# Patient Record
Sex: Male | Born: 1985 | Race: White | Hispanic: No | Marital: Single | Smoking: Former smoker
Health system: Southern US, Community
[De-identification: ages and names within clinical notes are randomized; demographics above are authoritative.]

## PROBLEM LIST (undated history)

## (undated) DIAGNOSIS — E7800 Pure hypercholesterolemia, unspecified: Secondary | ICD-10-CM

## (undated) DIAGNOSIS — I10 Essential (primary) hypertension: Secondary | ICD-10-CM

## (undated) HISTORY — DX: Essential (primary) hypertension: I10

## (undated) HISTORY — DX: Pure hypercholesterolemia, unspecified: E78.00

---

## 2005-04-22 ENCOUNTER — Ambulatory Visit: Payer: Self-pay | Admitting: Pediatrics

## 2005-10-31 ENCOUNTER — Ambulatory Visit: Payer: Self-pay | Admitting: Pediatrics

## 2006-02-20 ENCOUNTER — Ambulatory Visit: Payer: Self-pay | Admitting: Pediatrics

## 2006-08-08 ENCOUNTER — Ambulatory Visit: Payer: Self-pay | Admitting: Pediatrics

## 2006-11-24 ENCOUNTER — Ambulatory Visit: Payer: Self-pay | Admitting: Pediatrics

## 2007-06-03 ENCOUNTER — Ambulatory Visit: Payer: Self-pay | Admitting: Pediatrics

## 2007-09-15 ENCOUNTER — Ambulatory Visit: Payer: Self-pay | Admitting: Pediatrics

## 2008-03-30 ENCOUNTER — Ambulatory Visit: Payer: Self-pay | Admitting: Pediatrics

## 2008-08-03 ENCOUNTER — Ambulatory Visit: Payer: Self-pay | Admitting: Pediatrics

## 2008-11-01 ENCOUNTER — Ambulatory Visit: Payer: Self-pay | Admitting: Pediatrics

## 2009-02-15 ENCOUNTER — Ambulatory Visit: Payer: Self-pay | Admitting: Pediatrics

## 2009-06-21 ENCOUNTER — Ambulatory Visit: Payer: Self-pay | Admitting: Pediatrics

## 2010-04-25 ENCOUNTER — Emergency Department (HOSPITAL_COMMUNITY): Admission: EM | Admit: 2010-04-25 | Discharge: 2010-04-25 | Payer: Self-pay | Admitting: Emergency Medicine
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
## 2018-04-21 ENCOUNTER — Ambulatory Visit
Admission: RE | Admit: 2018-04-21 | Discharge: 2018-04-21 | Disposition: A | Discharge status: Home / Self Care | Payer: Managed Care, Other (non HMO) | Source: Ambulatory Visit | Attending: Nurse Practitioner | Admitting: Nurse Practitioner

## 2018-04-21 ENCOUNTER — Other Ambulatory Visit: Payer: Self-pay | Admitting: Nurse Practitioner

## 2018-04-21 DIAGNOSIS — M545 Low back pain: Secondary | ICD-10-CM

## 2018-05-04 ENCOUNTER — Ambulatory Visit: Payer: Managed Care, Other (non HMO) | Admitting: Physical Therapy

## 2018-05-04 ENCOUNTER — Encounter: Payer: Self-pay | Admitting: Physical Therapy

## 2018-05-04 NOTE — Therapy (Signed)
[facility] [address], [address] [place], [place], [address] Phone: [phone]   Fax:  [phone]  Physical Therapy Evaluation  Patient Details  Name: Jonathon LeechDonald Mcmenamin MRN: [id number] Date of Birth: 1986-06-27 No data recorded  Encounter Date: 05/04/2018    History reviewed. No pertinent past medical history.  History reviewed. No pertinent surgical history.  There were no vitals filed for this visit.    Patient left before the therapist was able to call his name for the initial evaluation.  Patient left due to insurance coverage.  Eulis FosterCheryl Clayton Bosserman, PT 05/04/18 12:36 PM                Objective measurements completed on examination: See above findings.                             Patient will benefit from skilled therapeutic intervention in order to improve the following deficits and impairments:     Visit Diagnosis: Chronic midline low back pain without sciatica     Problem List There are no active problems to display for this patient.   Brayen Bunn 05/04/2018, 12:36 PM  [facility] [address], [address] [place], [place], [address] Phone: [phone]   Fax:  [phone]  Name: Jonathon LeechDonald Lowman MRN: [id number] Date of Birth: 1986-06-27

## 2019-10-28 DIAGNOSIS — I10 Essential (primary) hypertension: Secondary | ICD-10-CM | POA: Diagnosis not present

## 2019-10-28 DIAGNOSIS — F900 Attention-deficit hyperactivity disorder, predominantly inattentive type: Secondary | ICD-10-CM | POA: Diagnosis not present

## 2020-04-24 DIAGNOSIS — I10 Essential (primary) hypertension: Secondary | ICD-10-CM | POA: Diagnosis not present

## 2020-04-24 DIAGNOSIS — F900 Attention-deficit hyperactivity disorder, predominantly inattentive type: Secondary | ICD-10-CM | POA: Diagnosis not present

## 2022-09-16 ENCOUNTER — Encounter: Payer: Self-pay | Admitting: Neurology

## 2022-10-10 ENCOUNTER — Encounter: Payer: Self-pay | Admitting: Internal Medicine

## 2022-10-10 ENCOUNTER — Ambulatory Visit: Payer: Managed Care, Other (non HMO) | Attending: Internal Medicine | Admitting: Internal Medicine

## 2022-10-10 VITALS — BP 104/74 | HR 78 | Ht 71.5 in | Wt 177.8 lb

## 2022-10-10 DIAGNOSIS — E785 Hyperlipidemia, unspecified: Secondary | ICD-10-CM | POA: Diagnosis not present

## 2022-10-10 DIAGNOSIS — I499 Cardiac arrhythmia, unspecified: Secondary | ICD-10-CM

## 2022-10-10 NOTE — Patient Instructions (Signed)
Medication Instructions:  No changes *If you need a refill on your cardiac medications before your next appointment, please call your pharmacy*  Follow-Up: At [facility], you and your health needs are our priority.  As part of our continuing mission to provide you with exceptional heart care, we have created designated Provider Care Teams.  These Care Teams include your primary Cardiologist (physician) and Advanced Practice Providers (APPs -  Physician Assistants and Nurse Practitioners) who all work together to provide you with the care you need, when you need it.  We recommend signing up for the patient portal called "MyChart".  Sign up information is provided on this After Visit Summary.  MyChart is used to connect with patients for Virtual Visits (Telemedicine).  Patients are able to view lab/test results, encounter notes, upcoming appointments, etc.  Non-urgent messages can be sent to your provider as well.   To learn more about what you can do with MyChart, go to [email or web address].    Your next appointment:    Follow up as needed  Provider:   Dr Branch   

## 2022-10-10 NOTE — Progress Notes (Signed)
Cardiology Office Note:    Date:  10/10/2022   ID:  Jonathon Holmes, DOB 07/07/1986, MRN [id number]  PCP:  Lavone Orn, [place] Providers Cardiologist:  None     Referring MD: Johna Roles, PA   No chief complaint on file. Sinus pause/ junctional rhythm  History of Present Illness:    Jonathon Holmes is a 37 y.o. male with no significant hx. ADD. He was seen by [facility]. He reported syncopal/seizure episode on 12/31. He was standing in the kitchen and ws confused and fell to the ground. His body was stiff and he lost consciousness for ~ 30 seconds to 1 minute. He was tired afterwards and slept for 12-16 hours. Was using CBD.  He had a ziopatch ordered which showed a 5 second sinus pause [ no time noted] , junctional rhythm in the 30s in the early AM that were triggered events  No other prior syncope  Current Medications: Current Outpatient Medications on File Prior to Visit  Medication Sig Dispense Refill   amphetamine-dextroamphetamine (ADDERALL) 10 MG tablet Take 10 mg by mouth daily.     lisinopril (ZESTRIL) 10 MG tablet Take 10 mg by mouth daily.     sertraline (ZOLOFT) 25 MG tablet Take 25 mg by mouth daily.     No current facility-administered medications on file prior to visit.    Allergies:   Patient has no known allergies.   Social History   Socioeconomic History   Marital status: Single    Spouse name: Not on file   Number of children: Not on file   Years of education: Not on file   Highest education level: Not on file  Occupational History   Not on file  Tobacco Use   Smoking status: Former    Types: Cigarettes   Smokeless tobacco: Never   Tobacco comments:    CBD GUMMIES  Substance and Sexual Activity   Alcohol use: Not on file   Drug use: Not on file   Sexual activity: Not on file  Other Topics Concern   Not on file  Social History Narrative   Not on file   Social Determinants of Health   Financial Resource Strain:  Not on file  Food Insecurity: Not on file  Transportation Needs: Not on file  Physical Activity: Not on file  Stress: Not on file  Social Connections: Not on file     Family History: Grandfather had MI at 31   ROS:   Please see the history of present illness.     All other systems reviewed and are negative.  EKGs/Labs/Other Studies Reviewed:    The following studies were reviewed today:   EKG:  EKG is  ordered today.  The ekg ordered today demonstrates   10/10/2022- NSR  Recent Labs: No results found for requested labs within last 365 days.  Recent Lipid Panel No results found for: "CHOL", "TRIG", "HDL", "CHOLHDL", "VLDL", "LDLCALC", "LDLDIRECT"   Risk Assessment/Calculations:     Physical Exam:    VS:   Vitals:   10/10/22 1612  BP: 104/74  Pulse: 78  SpO2: 95%     Wt Readings from Last 3 Encounters:  10/10/22 177 lb 12.8 oz (80.6 kg)     GEN:  Well nourished, well developed in no acute distress HEENT: Normal NECK: No JVD; No carotid bruits LYMPHATICS: No lymphadenopathy CARDIAC: RRR, no murmurs, rubs, gallops RESPIRATORY:  Clear to auscultation without rales, wheezing or rhonchi  ABDOMEN:  Soft, non-tender, non-distended MUSCULOSKELETAL:  No edema; No deformity  SKIN: Warm and dry NEUROLOGIC:  Alert and oriented x 3 PSYCHIATRIC:  Normal affect   ASSESSMENT:    Sinus pause; junctional rhythm: suspect in the setting of substance use. Recommend refraining from any substance use, it can be a trigger. PLAN:    In order of problems listed above:   No further cardiac follow up at this time      Medication Adjustments/Labs and Tests Ordered: Current medicines are reviewed at length with the patient today.  Concerns regarding medicines are outlined above.  Orders Placed This Encounter  Procedures   EKG 12-Lead   No orders of the defined types were placed in this encounter.   Patient Instructions  Medication Instructions:  No changes *If you  need a refill on your cardiac medications before your next appointment, please call your pharmacy*   Follow-Up: At [facility], you and your health needs are our priority.  As part of our continuing mission to provide you with exceptional heart care, we have created designated Provider Care Teams.  These Care Teams include your primary Cardiologist (physician) and Advanced Practice Providers (APPs -  Physician Assistants and Nurse Practitioners) who all work together to provide you with the care you need, when you need it.  We recommend signing up for the patient portal called "MyChart".  Sign up information is provided on this After Visit Summary.  MyChart is used to connect with patients for Virtual Visits (Telemedicine).  Patients are able to view lab/test results, encounter notes, upcoming appointments, etc.  Non-urgent messages can be sent to your provider as well.   To learn more about what you can do with MyChart, go to [email or web address].    Your next appointment:    Follow up as needed  Provider:    Dr Harl Bowie     Signed, Janina Mayo, MD  10/10/2022 4:25 PM    [place]

## 2022-10-16 ENCOUNTER — Encounter: Payer: Self-pay | Admitting: Neurology

## 2022-10-16 ENCOUNTER — Ambulatory Visit (INDEPENDENT_AMBULATORY_CARE_PROVIDER_SITE_OTHER): Payer: 59 | Admitting: Neurology

## 2022-10-16 VITALS — BP 118/81 | HR 73 | Ht 71.0 in | Wt 178.4 lb

## 2022-10-16 DIAGNOSIS — R4020 Unspecified coma: Secondary | ICD-10-CM

## 2022-10-16 NOTE — Progress Notes (Signed)
NEUROLOGY CONSULTATION NOTE  N[place] Cradle MRN: [id number] DOB: 10-09-1985  Referring provider: Thayer Ohm, PA Primary care provider: Thayer Ohm, PA  Reason for consult:  seizure-like activity   Thank you for your kind referral of Zephaniah Lubrano for consultation of the above symptoms. Although his history is well known to you, please allow me to reiterate it for the purpose of our medical record. The patient was accompanied to the clinic by his wife Karsten Fells who also provides collateral information. Records and images were personally reviewed where available.   HISTORY OF PRESENT ILLNESS: This is a 37 year old right-handed man with a history of hypertension, hyperlipidemia, ADD, presenting for evaluation of a seizure-like episode that occurred on 09/08/2022. He reports that he had been under a lot of stress from his work the week prior. On 09/08/22, Britney recalls that he seemed to have more energy/more excitable. He recalls talking to his sister in the kitchen then all of a sudden there was something in his head that felt really weird. He tried to reorient himself and recalls holding on to the counter. His next recollection was waking up on the floor. Britney reports he sat himself on the floor and did a controlled fall on his side then rolled on his back and went stiff. Eyes were open, he made a groan from the back of his throat. This lasted 30 seconds then he came out of it really scared and stood up, shot down the hallway then collapsed. He remembers waking up and freaking out, standing up fast and then falling. They both recall he was diaphoretic, sweating through his clothes, pale. He felt very exhausted, wanting to go to sleep. No tongue bite, incontinence, focal weakness. EMS arrived within 5-10 minutes with note of normal BP, glucose, EKG. No further episodes of loss of consciousness, however since then, he has had significant panic attacks, which are new symptoms for him. They deny  any prior history of anxiety. Since 12/31, he has had 3 panic attacks. The first episode occurred a week after, he had been thrown off his routine and it was the first day he was supposed to go back to work after the event. He then started feeling impending doom while he was in the bathroom. His legs started going, he was able to get back to bed and felt lightheaded, plopping down on the bed and asking for help. Britney woke up and he fell to the floor, "sweating puddles of sweat on the floor." He felt an intense cold with shivering, and he could not straighten out his left leg (flexed at the knee). He did not think he could move his hands. Legs were painful because of the cramping. He felt numbness and tingling down his spine. The next episode occurred at work, he again felt the sensation of impending doom and started doing deep breathing but felt he could not complete his breaths. He managed to get to a coworker, then he saw his other coworker who had previously written him up and symptoms got exceptionally worse. He almost fell to the floor, his hands were starting to curl in and legs felt weak. He called his wife and she had to half carry/half pull him into the house. The episode lasted 1.5 hours. The last episode was a week ago. He got a call from his insurance company and was fixated on his head about going back to work and dealing with his coworker, he started getting more agitated with uncontrollable crying. He  ended up crawling to get his phone and call Britney, who was able to talk him down. Episode lasted 25-30 minutes. There is no staring/unresponsiveness or confusion/speech difficulties with all these episodes.   He has noticed that since the event in December, he would smell or taste random things or have random cravings. He has been having more headaches with tingling in the back of his head. Sometimes he has a pressure in the midline which scares him. This is associated with some nausea. He gets  nauseated with quick turns. He has been having pain in his back radiating down the left leg. No bowel/bladder dysfunction. No myoclonic jerks. He has had personality changes, now emotionally extremely irritable/volatile, crying at the drop of a hat, which is very atypical for him. He has noticed he is not able to articulate like before. He was started on Sertraline after the last panic attack last week. He states that even now in the office he is feeling weird, looking up does not feel good. He lives with [place]. He works for [employer] and does double shifts. Standard is "too little sleep." No change in sleep patterns prior to the event in December. His maternal grandmother had panic attacks. He had a normal birth and early development.  There is no history of febrile convulsions, CNS infections such as meningitis/encephalitis, significant traumatic brain injury, neurosurgical procedures, or family history of seizures.  He was evaluated by Cardiology earlier this month, he had a Zio patch which showed a 5-second sinus pause, junctional rhythm in the 30s in the early AM that were triggered events. Notes indicate these are suspected triggered by substance use, no further follow-up needed.    PAST MEDICAL HISTORY: Past Medical History:  Diagnosis Date   High cholesterol    Hypertension     PAST SURGICAL HISTORY: History reviewed. No pertinent surgical history.  MEDICATIONS: Current Outpatient Medications on File Prior to Visit  Medication Sig Dispense Refill   amphetamine-dextroamphetamine (ADDERALL) 10 MG tablet Take 10 mg by mouth daily.     lisinopril (ZESTRIL) 10 MG tablet Take 10 mg by mouth daily.     Multiple Vitamin (MULTIVITAMIN) tablet Take 1 tablet by mouth daily.     Omega-3 Fatty Acids (FISH OIL OMEGA-3 PO) Take by mouth.     sertraline (ZOLOFT) 25 MG tablet Take 25 mg by mouth daily.     No current facility-administered medications on file prior to visit.    ALLERGIES: Allergies   Allergen Reactions   Statins Other (See Comments)    Pain and stiffness     FAMILY HISTORY: Family History  Problem Relation Age of Onset   Diabetes Mother    Hypertension Mother    Diabetes Father    Hypertension Father    High Cholesterol Maternal Grandmother    Hypertension Maternal Grandmother    High Cholesterol Maternal Grandfather    Hypertension Maternal Grandfather    High Cholesterol Paternal Grandmother    Hypertension Paternal Grandmother    Hypertension Paternal Grandfather    High Cholesterol Paternal Grandfather     SOCIAL HISTORY: Social History   Socioeconomic History   Marital status: Single    Spouse name: Not on file   Number of children: Not on file   Years of education: Not on file   Highest education level: Not on file  Occupational History   Not on file  Tobacco Use   Smoking status: Former    Types: Cigarettes   Smokeless tobacco: Never  Tobacco comments:    CBD GUMMIES  Vaping Use   Vaping Use: Former  Substance and Sexual Activity   Alcohol use: Yes    Comment: occ   Drug use: Not Currently    Comment: CBD   Sexual activity: Not on file  Other Topics Concern   Not on file  Social History Narrative   Are you right handed or left handed? Right handed    Are you currently employed ?  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 No    Who lives with you? wife   What type of home do you live in: 1 story or 2 story?  1 story        Social Determinants of Radio broadcast assistant Strain: Not on file  Food Insecurity: Not on file  Transportation Needs: Not on file  Physical Activity: Not on file  Stress: Not on file  Social Connections: Not on file  Intimate Partner Violence: Not on file     PHYSICAL EXAM: Vitals:   10/16/22 1026  BP: 118/81  Pulse: 73  SpO2: 97%   General: No acute distress Head:  Normocephalic/atraumatic Skin/Extremities: No rash, no  edema Neurological Exam: Mental status: alert and oriented to person, place, and time, no dysarthria or aphasia, Fund of knowledge is appropriate.  Recent and remote memory are intact, 3/3 delayed recall.  Attention and concentration are normal, 5/5 WORLD backwards.  Cranial nerves: CN I: not tested CN II: pupils equal, round, visual fields intact CN III, IV, VI:  full range of motion, no nystagmus, no ptosis CN V: facial sensation intact but reports delay with feeling cold sensation on left V1-2 CN VII: upper and lower face symmetric CN VIII: hearing intact to conversation Bulk & Tone: normal, no fasciculations. Motor: 5/5 throughout with no pronator drift. Sensation: intact to light touch, cold on both UE, intact cold on both LE, decreased pin on left LE, intact vibration sense.  Romberg test negative Deep Tendon Reflexes: +1 throughout Cerebellar: no incoordination on finger to nose testing Gait: narrow-based and steady, able to tandem walk adequately. Tremor: none   IMPRESSION: This is a 37 year old right-handed man with a history of hypertension, hyperlipidemia, ADD, presenting for evaluation of a seizure-like episode that occurred on 09/08/2022. He felt a sensation in his head then had a witnessed episode of loss of consciousness/stiffening for 30 seconds. He was pale and diaphoretic afterwards, ?convulsive syncope versus seizure. No further similar episodes since 12/31, however he has new changes in personality as well as panic attacks which he did not have previously. Neurological exam overall normal. Etiology symptoms unclear, he had a heart monitor and has seen Cardiology. From a neurological standpoint, we will do a brain MRI with and without contrast and 1-hour EEG. If normal, a 48-hour EEG will be ordered to further classify symptoms. With possible seizure and change in personality, bloodwork for autoimmune panel will be ordered. [place] driving laws were discussed with the patient, and  he knows to stop driving after an episode of loss of consciousness until 6 months event-free. Follow-up after tests, call for any changes.    Thank you for allowing me to participate in the care of this patient. Please do not hesitate to call for any questions or concerns.   Ellouise Newer, M.D.  CC: Thayer Ohm, [place]

## 2022-10-16 NOTE — Patient Instructions (Signed)
Good to meet you.  Schedule MRI brain with and without contrast  2. Schedule 1-hour EEG. If normal, we will do a 2-day home EEG  3. It is prudent to recommend that all persons should be free of syncopal (passing out) episodes for at least six months to be granted the driving privilege." ([place], Second Edition, Medical Review Branch, Engineer, site, Division of [employer] of Transportation, July 2004)  4. Follow-up after tests, call for any changes

## 2022-10-23 ENCOUNTER — Other Ambulatory Visit: Payer: 59

## 2022-10-24 ENCOUNTER — Ambulatory Visit (INDEPENDENT_AMBULATORY_CARE_PROVIDER_SITE_OTHER): Payer: 59 | Admitting: Neurology

## 2022-10-24 DIAGNOSIS — R4020 Unspecified coma: Secondary | ICD-10-CM | POA: Diagnosis not present

## 2022-10-30 NOTE — Procedures (Signed)
ELECTROENCEPHALOGRAM REPORT  Date of Study: 10/24/2022  Patient's Name: Jonathon Holmes MRN: [id number] Date of Birth: Nov 22, 1985  Referring Provider: Dr. Ellouise Newer  Clinical History: This is a 37 year old man with episode of loss of consciousness/stiffening. EEG for classification.  Medications: Adderall, Lisinopril, Sertraline  Technical Summary: A multichannel digital 1-hour EEG recording measured by the international 10-20 system with electrodes applied with paste and impedances below 5000 ohms performed in our laboratory with EKG monitoring in an awake and asleep patient.  Hyperventilation and photic stimulation were performed.  The digital EEG was referentially recorded, reformatted, and digitally filtered in a variety of bipolar and referential montages for optimal display.    Description: The patient is awake and asleep during the recording.  During maximal wakefulness, there is a symmetric, medium voltage 10.5-11 Hz posterior dominant rhythm that attenuates with eye opening.  The record is symmetric.  During drowsiness and sleep, there is an increase in theta slowing of the background.  Vertex waves and symmetric sleep spindles were seen. Hyperventilation and photic stimulation did not elicit any abnormalities.  There were no epileptiform discharges or electrographic seizures seen.    EKG lead was unremarkable.  Impression: This 1-hour awake and asleep EEG is normal.    Clinical Correlation: A normal EEG does not exclude a clinical diagnosis of epilepsy.  If further clinical questions remain, prolonged EEG may be helpful.  Clinical correlation is advised.   Ellouise Newer, M.D.

## 2022-10-31 ENCOUNTER — Telehealth: Payer: Self-pay

## 2022-10-31 DIAGNOSIS — R4020 Unspecified coma: Secondary | ICD-10-CM

## 2022-10-31 NOTE — Telephone Encounter (Signed)
Pt called an informed that the EEG is normal. Proceed with brain MRI as scheduled. We discussed doing a 48-hour EEG if the routine EEG is normal, if he would like to proceed. Pt would like to proceed with 48 hour eeg order placed

## 2022-10-31 NOTE — Telephone Encounter (Signed)
-----   Message from Cameron Sprang, MD sent at 10/31/2022  8:56 AM EST ----- Pls let him know the EEG is normal. Proceed with brain MRI as scheduled. We discussed doing a 48-hour EEG if the routine EEG is normal, if he would like to proceed, pls order, thanks

## 2022-11-06 ENCOUNTER — Other Ambulatory Visit: Payer: 59

## 2022-11-08 ENCOUNTER — Telehealth: Payer: Self-pay | Admitting: *Deleted

## 2022-12-03 ENCOUNTER — Telehealth: Payer: Self-pay | Admitting: Anesthesiology

## 2022-12-03 ENCOUNTER — Encounter: Payer: Self-pay | Admitting: Neurology

## 2022-12-03 NOTE — Telephone Encounter (Signed)
Pt called he has not had any more seizures. He stated that he is ok if you write that due to his medical condition, he cannot drive until 6 months from last episode

## 2022-12-03 NOTE — Telephone Encounter (Signed)
Has he had any more since 12/31? I can write that due to his medical condition, he cannot drive until 6 months from last episode. Is he okay with this?

## 2022-12-03 NOTE — Telephone Encounter (Signed)
Done, thanks

## 2022-12-03 NOTE — Telephone Encounter (Signed)
Pt called informed letter is ready

## 2022-12-03 NOTE — Telephone Encounter (Signed)
Pt called stating he would like for Dr Delice Lesch to write a note for his job stating that since he is not able to drive right now due his seizures it is a medical accomodation to have his family drive him to work and pick him up as well. States his manager is not being accommodating and its keeping him longer at work. Requests call back.

## 2023-02-10 ENCOUNTER — Encounter: Payer: Self-pay | Admitting: Neurology

## 2023-08-04 ENCOUNTER — Other Ambulatory Visit (HOSPITAL_COMMUNITY): Payer: Self-pay | Admitting: Physician Assistant

## 2023-08-04 DIAGNOSIS — E7800 Pure hypercholesterolemia, unspecified: Secondary | ICD-10-CM

## 2023-09-15 ENCOUNTER — Ambulatory Visit (HOSPITAL_COMMUNITY)
Admission: RE | Admit: 2023-09-15 | Discharge: 2023-09-15 | Disposition: A | Discharge status: Home / Self Care | Payer: Self-pay | Source: Ambulatory Visit | Attending: Physician Assistant | Admitting: Physician Assistant

## 2023-09-15 DIAGNOSIS — E7800 Pure hypercholesterolemia, unspecified: Secondary | ICD-10-CM | POA: Insufficient documentation

## 2024-03-17 ENCOUNTER — Encounter: Payer: Self-pay | Admitting: Physician Assistant

## 2024-03-17 ENCOUNTER — Ambulatory Visit (INDEPENDENT_AMBULATORY_CARE_PROVIDER_SITE_OTHER): Admitting: Physician Assistant

## 2024-03-17 ENCOUNTER — Other Ambulatory Visit (INDEPENDENT_AMBULATORY_CARE_PROVIDER_SITE_OTHER)

## 2024-03-17 DIAGNOSIS — G8929 Other chronic pain: Secondary | ICD-10-CM

## 2024-03-17 DIAGNOSIS — M545 Low back pain, unspecified: Secondary | ICD-10-CM

## 2024-03-17 NOTE — Progress Notes (Signed)
 Office Visit Note   Patient: Jonathon Holmes           Date of Birth: 10/23/1985           MRN: [id number] Visit Date: 03/17/2024              Requested by: Alys Schuyler HERO, PA [address] [place],  [place] [address] PCP: Alys Schuyler HERO, [place]   Assessment & Plan: Visit Diagnoses:  1. Chronic bilateral low back pain without sciatica     Plan: Patient is a very pleasant 38 year old gentleman who works for [employer] and is very active.  He is the father of his 2-month-old child and spends a lot of time with her and his wife.  He comes in today with a long history of low back pain dating back to when he was 38 years old.  He says he gets about an issue about once a year where he is completely almost bedridden with pain.  He is concerned because this is getting more frequent.  He has tried alternative trigger treatments including chiropractic care, massage therapy and back strengthening exercises.  It every year this seems to be worse and more prolonged.  His most recent episode started 6 weeks ago when he was in bed for a week he is just recovering from this now.  He has made some modifications to his job.  I am concerned as he said now he is starting to get significant weakness in his left leg and it gives out on him with associated paresthesias.  He does have a positive straight leg raise.  He states that recently when he was taking care of his daughter he had to crawl on the floor because he could not walk.  No loss of bowel or bladder control per se because of this and the length of time and his weakness and progressive pattern I have recommended an MRI and then follow-up with Megan.  Has also failed treatment with anti-inflammatories and steroids  Follow-Up Instructions: With Megan after MRI  Orders:  Orders Placed This Encounter  Procedures   XR Lumbar Spine 2-3 Views   No orders of the defined types were placed in this encounter.     Procedures: No procedures  performed   Clinical Data: No additional findings.   Subjective: No chief complaint on file.   HPI patient is a 38 year old gentleman who comes in today with a history of being bedridden with back pain for 7 days he was told he had a pinched nerve he has been taking Aleve and ibuprofen without much relief   Review of Systems  All other systems reviewed and are negative.    Objective: Vital Signs: There were no vitals taken for this visit.  Physical Exam Constitutional:      Appearance: Normal appearance.  Pulmonary:     Effort: Pulmonary effort is normal.  Skin:    General: Skin is warm and dry.  Neurological:     General: No focal deficit present.     Mental Status: He is alert and oriented to person, place, and time.  Psychiatric:        Mood and Affect: Mood normal.        Behavior: Behavior normal.     Ortho Exam Patient of his lower back he has pain with forward flexion and can only slowly extend to his standing after bending forward.  Pain over the lower back into the left buttock with extension.  He has a positive straight leg raise on the left none on the right.  He has altered sensation that runs from his left lower buttock down his leg to his toes.  Strength is intact with good dorsiflexion plantarflexion extension and flexion of his legs.  No step-offs noted on exam Specialty Comments:  No specialty comments available.  Imaging: No results found.   PMFS History: There are no active problems to display for this patient.  Past Medical History:  Diagnosis Date   High cholesterol    Hypertension     Family History  Problem Relation Age of Onset   Diabetes Mother    Hypertension Mother    Diabetes Father    Hypertension Father    High Cholesterol Maternal Grandmother    Hypertension Maternal Grandmother    High Cholesterol Maternal Grandfather    Hypertension Maternal Grandfather    High Cholesterol Paternal Grandmother    Hypertension Paternal  Grandmother    Hypertension Paternal Grandfather    High Cholesterol Paternal Grandfather     History reviewed. No pertinent surgical history. Social History   Occupational History   Not on file  Tobacco Use   Smoking status: Former    Types: Cigarettes   Smokeless tobacco: Never   Tobacco comments:    CBD GUMMIES  Vaping Use   Vaping status: Former  Substance and Sexual Activity   Alcohol use: Yes    Comment: occ   Drug use: Not Currently    Comment: CBD   Sexual activity: Not on file

## 2024-03-22 ENCOUNTER — Encounter (HOSPITAL_BASED_OUTPATIENT_CLINIC_OR_DEPARTMENT_OTHER): Payer: Self-pay | Admitting: Internal Medicine

## 2024-03-22 ENCOUNTER — Other Ambulatory Visit (HOSPITAL_COMMUNITY): Payer: Self-pay

## 2024-03-22 ENCOUNTER — Ambulatory Visit (INDEPENDENT_AMBULATORY_CARE_PROVIDER_SITE_OTHER): Admitting: Internal Medicine

## 2024-03-22 ENCOUNTER — Telehealth: Payer: Self-pay | Admitting: Pharmacy Technician

## 2024-03-22 ENCOUNTER — Telehealth (HOSPITAL_BASED_OUTPATIENT_CLINIC_OR_DEPARTMENT_OTHER): Payer: Self-pay

## 2024-03-22 VITALS — BP 130/79 | HR 66 | Ht 71.0 in | Wt 191.0 lb

## 2024-03-22 DIAGNOSIS — Z8249 Family history of ischemic heart disease and other diseases of the circulatory system: Secondary | ICD-10-CM | POA: Diagnosis not present

## 2024-03-22 DIAGNOSIS — R931 Abnormal findings on diagnostic imaging of heart and coronary circulation: Secondary | ICD-10-CM | POA: Diagnosis not present

## 2024-03-22 DIAGNOSIS — G720 Drug-induced myopathy: Secondary | ICD-10-CM | POA: Diagnosis not present

## 2024-03-22 DIAGNOSIS — E785 Hyperlipidemia, unspecified: Secondary | ICD-10-CM

## 2024-03-22 DIAGNOSIS — E7800 Pure hypercholesterolemia, unspecified: Secondary | ICD-10-CM

## 2024-03-22 DIAGNOSIS — T466X5D Adverse effect of antihyperlipidemic and antiarteriosclerotic drugs, subsequent encounter: Secondary | ICD-10-CM

## 2024-03-22 NOTE — Telephone Encounter (Signed)
 Genetic test for Hypercholesteremia ordered ([facility]) Cheek swab completed in office Specimen and necessary paperwork mailed. ID: [id number]

## 2024-03-22 NOTE — Telephone Encounter (Signed)
 Pharmacy Patient Advocate Encounter   Received notification from Physician's Office that prior authorization for Repatha is required/requested.   Insurance verification completed.   The patient is insured through [place] .   Per test claim: The current 03/22/24 day co-pay is, $24.99- one month.  No PA needed at this time. This test claim was processed through [facility]- copay amounts may vary at other pharmacies due to pharmacy/plan contracts, or as the patient moves through the different stages of their insurance plan.

## 2024-03-22 NOTE — Patient Instructions (Signed)
 Medication Instructions:  Dr. Mona recommends REpatha (PCSK9). This is an injectable cholesterol medication self-administered once every 14 days. This medication will likely need prior approval with your insurance company, which we will work on. If the medication is not approved initially, we may need to do an appeal with your insurance.   Administer medication in area of fatty tissue such as abdomen, outer thigh, back of upper arm - and rotate site with each injection Store medication in refrigerator until ready to administer - allow to sit at room temp for 30 mins - 1 hour prior to injection Dispose of medication in a SHARPS container - your pharmacy should be able to direct you on this and proper disposal   If you need a co-pay card for Repatha: Lawsponsor.fr If you need a co-pay card for Praluent: [email or web address]  Patient Assistance:    These foundations have funds at various times.   The PAN Foundation: https://www.panfoundation.org/disease-funds/hypercholesterolemia/ -- can sign up for wait list  The [facility] offers assistance to help pay for medication copays.  They will cover copays for all cholesterol lowering meds, including statins, fibrates, omega-3 fish oils like Vascepa, ezetimibe, Repatha, Praluent, Nexletol, Nexlizet.  The cards are usually good for $2,500 or 12 months, whichever comes first. Our fax # is [phone] (you will need this to apply) Go to healthwellfoundation.org Click on "Apply Now" Answer questions as to whom is applying (patient or representative) Your disease fund will be "hypercholesterolemia - Medicare access" They will ask questions about finances and which medications you are taking for cholesterol When you submit, the approval is usually within minutes.  You will need to print the card information from the site You will need to show this information to your pharmacy, they will bill your  Medicare Part D plan first -then bill Health Well --for the copay.   You can also call them at [phone], although the hold times can be quite long.    *If you need a refill on your cardiac medications before your next appointment, please call your pharmacy*  Lab Work: FASTING NMR, LPa, and CK today  Repeat FASTING NMR in 3 months  If you have labs (blood work) drawn today and your tests are completely normal, you will receive your results only by: MyChart Message (if you have MyChart) OR A paper copy in the mail If you have any lab test that is abnormal or we need to change your treatment, we will call you to review the results.  Testing/Procedures: Genetic test for Hypercholesteremia ordered ([facility]) Cheek swab completed in office Specimen and necessary paperwork mailed.  Follow-Up: At [facility], you and your health needs are our priority.  As part of our continuing mission to provide you with exceptional heart care, our providers are all part of one team.  This team includes your primary Cardiologist (physician) and Advanced Practice Providers or APPs (Physician Assistants and Nurse Practitioners) who all work together to provide you with the care you need, when you need it.  Your next appointment:   TBD- based on genetic test and lab work  Other Instructions Genetic testing was completed in office. Results take about 2-3 weeks to come back. Dr. Mona will reach out with results.

## 2024-03-23 LAB — NMR, LIPOPROFILE
Cholesterol, Total: 267 mg/dL — ABNORMAL HIGH (ref 100–199)
HDL Particle Number: 39.3 umol/L (ref >=30.5)
HDL-C: 57 mg/dL (ref >39)
LDL Particle Number: 2234 nmol/L — ABNORMAL HIGH (ref <1000)
LDL Size: 21 nm (ref >20.5)
LDL-C (NIH Calc): 167 mg/dL — ABNORMAL HIGH (ref 0–99)
LP-IR Score: 53 — ABNORMAL HIGH (ref <=45)
Small LDL Particle Number: 1094 nmol/L — ABNORMAL HIGH (ref <=527)
Triglycerides: 231 mg/dL — ABNORMAL HIGH (ref 0–149)

## 2024-03-23 LAB — CK: Total CK: 124 U/L (ref 49–439)

## 2024-03-23 LAB — LIPOPROTEIN A (LPA): Lipoprotein (a): 278 nmol/L — ABNORMAL HIGH (ref <75.0)

## 2024-03-23 NOTE — Progress Notes (Signed)
 LIPID CLINIC CONSULT NOTE  Chief Complaint:  Manage dyslipidemia  Primary Care Physician: Cunningham, Alexus, PA  Primary Cardiologist:  None  HPI:  Jonathon Holmes is a 38 y.o. male who is being seen today for the evaluation of dyslipidemia at the request of Stacia, Alexus, PA.  This is a pleasant 38 year old male kindly referred for evaluation management of dyslipidemia.  He has a history of high cholesterol but has been intolerant to numerous statins due to pain and muscle stiffness.  He currently is on ezetimibe 10 mg daily.  Labs from November 2024 showed total cholesterol 329, HDL 73, triglycerides 131 and LDL 233.  He has had a prior coronary calcium score which was 180, 99th percentile for age and sex matched controls (09/2023).  He is referred today for evaluation of possible familial hyperlipidemia and recommendations for treatment options.  He does report heart disease in the family including an uncle, grandfather, father and mother with high cholesterol and other family members.  PMHx:  Past Medical History:  Diagnosis Date   High cholesterol    Hypertension     History reviewed. No pertinent surgical history.  FAMHx:  Family History  Problem Relation Age of Onset   Diabetes Mother    Hypertension Mother    Diabetes Father    Hypertension Father    High Cholesterol Maternal Grandmother    Hypertension Maternal Grandmother    High Cholesterol Maternal Grandfather    Hypertension Maternal Grandfather    High Cholesterol Paternal Grandmother    Hypertension Paternal Grandmother    Hypertension Paternal Grandfather    High Cholesterol Paternal Grandfather     SOCHx:   reports that he has quit smoking. His smoking use included cigarettes. He has never used smokeless tobacco. He reports current alcohol use. He reports that he does not currently use drugs.  ALLERGIES:  Allergies  Allergen Reactions   Statins Other (See Comments)    Pain and stiffness      ROS: Pertinent items noted in HPI and remainder of comprehensive ROS otherwise negative.  HOME MEDS: Current Outpatient Medications on File Prior to Visit  Medication Sig Dispense Refill   amphetamine-dextroamphetamine (ADDERALL) 10 MG tablet Take 10 mg by mouth daily.     lisinopril (ZESTRIL) 10 MG tablet Take 10 mg by mouth daily.     Multiple Vitamin (MULTIVITAMIN) tablet Take 1 tablet by mouth daily.     Omega-3 Fatty Acids (FISH OIL OMEGA-3 PO) Take by mouth.     sertraline (ZOLOFT) 25 MG tablet Take 25 mg by mouth daily.     ezetimibe (ZETIA) 10 MG tablet Take 10 mg by mouth daily.     No current facility-administered medications on file prior to visit.    LABS/IMAGING: No results found for this or any previous visit (from the past 48 hours). No results found.  LIPID PANEL: No results found for: CHOL, TRIG, HDL, CHOLHDL, VLDL, LDLCALC, LDLDIRECT  No results found for: LIPOA   WEIGHTS: Wt Readings from Last 3 Encounters:  03/22/24 191 lb (86.6 kg)  10/16/22 178 lb 6.4 oz (80.9 kg)  10/10/22 177 lb 12.8 oz (80.6 kg)    VITALS: BP 130/79 (BP Location: Left Arm, Patient Position: Sitting, Cuff Size: Normal)   Pulse 66   Ht 5' 11 (1.803 m)   Wt 191 lb (86.6 kg)   SpO2 96%   BMI 26.64 kg/m   EXAM: Deferred  EKG: Deferred  ASSESSMENT: Dyslipidemia with LDL greater than 190,  possible familial hyperlipidemia by Ray Finical criteria Multiple family members with cardiovascular disease, premature onset CAC score 180, 99th percentile (09/2023) 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   Mr. Bascom has a very high LDL cholesterol but cannot tolerate statins.  He is currently on ezetimibe but also had a high calcium score.  There are multiple family members with early onset heart disease in them concerned about a familial hyperlipidemia.  He would benefit from a PCSK9 inhibitor with regards to further lipid-lowering.  This would be  in addition to his ezetimibe.  Prior to starting this however we will check an NMR and LP(a).  Since he says he has had elevated CKs before we will check a baseline CK both prior to therapy.  If approved and he tolerates the medication we will plan to repeat fasting NMR in about 3 months.  I also think he benefit from genetic testing.  He is agreeable to this today and understands its likely to be covered by insurance but if not would be no more than a $299 charge.  I will get back to him with those results in about 2 to 3 weeks when they are typically available.  Thanks again for the kind referral.  Vinie KYM Maxcy, MD, [facility], FNLA, FACP    [facility]  Medical Director of the Advanced Lipid Disorders &  Cardiovascular Risk Reduction Clinic Diplomate of the American Board of Clinical Lipidology Attending Cardiologist  Direct Dial: [phone]  Fax: [phone]  Website:  [email or web address] Vinie BROCKS Cristan Hout 03/23/2024, 1:17 PM

## 2024-03-24 ENCOUNTER — Ambulatory Visit: Admitting: Cardiovascular Disease

## 2024-03-26 ENCOUNTER — Ambulatory Visit: Payer: Self-pay | Admitting: Internal Medicine

## 2024-03-26 DIAGNOSIS — E785 Hyperlipidemia, unspecified: Secondary | ICD-10-CM

## 2024-03-29 ENCOUNTER — Ambulatory Visit
Admission: RE | Admit: 2024-03-29 | Discharge: 2024-03-29 | Disposition: A | Discharge status: Home / Self Care | Source: Ambulatory Visit | Attending: Physician Assistant | Admitting: Physician Assistant

## 2024-03-29 ENCOUNTER — Ambulatory Visit: Admitting: Physician Assistant

## 2024-03-29 DIAGNOSIS — M545 Low back pain, unspecified: Secondary | ICD-10-CM

## 2024-03-31 MED ORDER — REPATHA 140 MG/ML ~~LOC~~ SOSY: 140.0000 mg | PREFILLED_SYRINGE | SUBCUTANEOUS | 11 refills | Status: AC

## 2024-03-31 NOTE — Telephone Encounter (Signed)
 Patient called to follow up on the status of getting Repatha  medication.

## 2024-04-12 ENCOUNTER — Ambulatory Visit: Admitting: Physical Medicine and Rehabilitation

## 2024-04-12 ENCOUNTER — Encounter: Payer: Self-pay | Admitting: Physical Medicine and Rehabilitation

## 2024-04-12 DIAGNOSIS — G8929 Other chronic pain: Secondary | ICD-10-CM | POA: Diagnosis not present

## 2024-04-12 DIAGNOSIS — M7918 Myalgia, other site: Secondary | ICD-10-CM | POA: Diagnosis not present

## 2024-04-12 DIAGNOSIS — M5416 Radiculopathy, lumbar region: Secondary | ICD-10-CM

## 2024-04-12 DIAGNOSIS — M5116 Intervertebral disc disorders with radiculopathy, lumbar region: Secondary | ICD-10-CM | POA: Diagnosis not present

## 2024-04-12 NOTE — Progress Notes (Unsigned)
 Pain Scale   Average Pain 2 Patient advising he has lower back pain that occurs when moving. Patient her for MRI review        +Driver, -BT, -Dye Allergies.

## 2024-04-12 NOTE — Progress Notes (Unsigned)
 Jonathon Holmes - 38 y.o. male MRN [id number]  Date of birth: Oct 08, 1985  Office Visit Note: Visit Date: 04/12/2024 PCP: Stacia Millman, PA Referred by: Alys Schuyler HERO, PA  Subjective: Chief Complaint  Patient presents with   Lower Back - Pain   HPI: Jonathon Holmes is a 38 y.o. male who comes in today per the request of Jonathon Dragon Persons, PA for evaluation of chronic left sided lower back pain radiating to buttock and down lateral aspect of left leg. Also reports pain radiating up back to left thoracic region. Pain ongoing since 38 years of age. His pain has increased over the last several months. He describes pain as sharp and pulsating, currently rates as 2 out of 10. Some relief of pain with home exercise regimen, rest and use of medications. Good relief of pain with Aleve. History of formal physical therapy, chiropractic treatments and massage therapy with some relief of pain. Recent lumbar MRI imaging shows substantial left lateral recess disc protrusion contributes to left-sided impingement at the L4-L5 level. No high grade spinal canal stenosis. No history of lumbar surgery/injections. 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 Patient denies focal weakness, numbness and tingling. No recent trauma or falls.         Review of Systems  Musculoskeletal:  Positive for back pain and myalgias.  Neurological:  Negative for tingling, sensory change, focal weakness and weakness.  All other systems reviewed and are negative.  Otherwise per HPI.  Assessment & Plan: Visit Diagnoses:    ICD-10-CM   1. Chronic left-sided low back pain with left-sided sciatica  M54.42 Ambulatory referral to Physical Therapy   G89.29     2. Radiculopathy, lumbar region  M54.16 Ambulatory referral to Physical Therapy    3. Intervertebral disc disorders with radiculopathy, lumbar region  M51.16 Ambulatory referral to Physical Therapy    4. Myofascial pain syndrome  M79.18        Plan:  Findings:  Chronic left sided lower back pain radiating to buttock and down lateral aspect of left leg. He continues to have mild pain at this time. His pain does seem to be manageable at home with continued home exercise regimen, rest and use of medications. I discussed recent lumbar MRI with him today using imaging and spine model. There is left sided disc protrusion at L4-L5 that I feel is contributing to his symptoms. We discussed treatment plan in detail today. I explained to him that typically disc protrusions will resolve over time. I also placed order for short course of formal physical therapy with a focus on core strengthening. Should his pain increase we discussed possibility of performing lumbar epidural steroid injection. He has no questions at this time. We are happy to see him back as needed. No red flag symptoms noted upon exam today.     Meds & Orders: No orders of the defined types were placed in this encounter.   Orders Placed This Encounter  Procedures   Ambulatory referral to Physical Therapy    Follow-up: Return if symptoms worsen or fail to improve.   Procedures: No procedures performed      Clinical History: CLINICAL DATA:  Chronic low back pain radiating down the left leg   EXAM: MRI LUMBAR SPINE WITHOUT CONTRAST   TECHNIQUE: Multiplanar, multisequence MR imaging of the lumbar spine was performed. No intravenous contrast was administered.   COMPARISON:  Radiographs 03/17/2024   FINDINGS: Segmentation: The lowest lumbar type non-rib-bearing vertebra is labeled as  L5.   Alignment:  No vertebral subluxation is observed.   Vertebrae: Substantial loss of disc height at L5-S1 and mild loss of disc height at L4-5. Mild type 1 degenerative endplate findings posteriorly at L4-5 and mild type 2 degenerative endplate findings at L5-S1.   Conus medullaris and cauda equina: Conus extends to the L1 level. Conus and cauda equina appear normal.   Paraspinal and other  soft tissues: Unremarkable   Disc levels:   L1-2: Unremarkable   L2-3: Unremarkable   L3-4: No impingement.  Mild disc bulge   L4-5: Moderate left foraminal stenosis and moderate left subarticular lateral recess stenosis due to a large left lateral recess disc protrusion which extends mostly cephalad. Mild disc bulge.   L5-S1: Borderline bilateral foraminal stenosis due to facet arthropathy and disc osteophyte complex.   IMPRESSION: 1. A substantial left lateral recess disc protrusion contributes to left-sided impingement at the L4-5 level.     Electronically Signed   By: Ryan Salvage M.D.   On: 04/03/2024 20:09   He reports that he has quit smoking. His smoking use included cigarettes. He has never used smokeless tobacco. No results for input(s): HGBA1C, LABURIC in the last 8760 hours.  Objective:  VS:  HT:    WT:   BMI:     BP:   HR: bpm  TEMP: ( )  RESP:  Physical Exam Vitals and nursing note reviewed.  HENT:     Head: Normocephalic and atraumatic.     Right Ear: External ear normal.     Left Ear: External ear normal.     Nose: Nose normal.     Mouth/Throat:     Mouth: Mucous membranes are moist.  Eyes:     Extraocular Movements: Extraocular movements intact.  Cardiovascular:     Rate and Rhythm: Normal rate.     Pulses: Normal pulses.  Pulmonary:     Effort: Pulmonary effort is normal.  Abdominal:     General: Abdomen is flat. There is no distension.  Musculoskeletal:        General: Tenderness present.     Cervical back: Normal range of motion.     Comments: Patient rises from seated position to standing without difficulty. Good lumbar range of motion. No pain noted with facet loading. 5/5 strength noted with bilateral hip flexion, knee flexion/extension, ankle dorsiflexion/plantarflexion and EHL. No clonus noted bilaterally. No pain upon palpation of greater trochanters. No pain with internal/external rotation of bilateral hips. Sensation  intact bilaterally. Positive slump test on the right. Dysesthesias noted to left L5 dermatome. Ambulates without aid, gait steady.     Skin:    General: Skin is warm and dry.     Capillary Refill: Capillary refill takes less than 2 seconds.  Neurological:     General: No focal deficit present.     Mental Status: He is alert and oriented to person, place, and time.  Psychiatric:        Mood and Affect: Mood normal.        Behavior: Behavior normal.     Ortho Exam  Imaging: No results found.  Past Medical/Family/Surgical/Social History: Medications & Allergies reviewed per EMR, new medications updated. There are no active problems to display for this patient.  Past Medical History:  Diagnosis Date   High cholesterol    Hypertension    Family History  Problem Relation Age of Onset   Diabetes Mother    Hypertension Mother    Diabetes Father  Hypertension Father    High Cholesterol Maternal Grandmother    Hypertension Maternal Grandmother    High Cholesterol Maternal Grandfather    Hypertension Maternal Grandfather    High Cholesterol Paternal Grandmother    Hypertension Paternal Grandmother    Hypertension Paternal Grandfather    High Cholesterol Paternal Grandfather    History reviewed. No pertinent surgical history. Social History   Occupational History   Not on file  Tobacco Use   Smoking status: Former    Types: Cigarettes   Smokeless tobacco: Never   Tobacco comments:    CBD GUMMIES  Vaping Use   Vaping status: Former  Substance and Sexual Activity   Alcohol use: Yes    Comment: occ   Drug use: Not Currently    Comment: CBD   Sexual activity: Not on file

## 2024-04-22 ENCOUNTER — Encounter (HOSPITAL_BASED_OUTPATIENT_CLINIC_OR_DEPARTMENT_OTHER): Payer: Self-pay | Admitting: Internal Medicine

## 2024-05-04 ENCOUNTER — Encounter: Payer: Self-pay | Admitting: Rehabilitative and Restorative Service Providers"

## 2024-05-04 ENCOUNTER — Ambulatory Visit (INDEPENDENT_AMBULATORY_CARE_PROVIDER_SITE_OTHER): Admitting: Rehabilitative and Restorative Service Providers"

## 2024-05-04 DIAGNOSIS — M5459 Other low back pain: Secondary | ICD-10-CM | POA: Diagnosis not present

## 2024-05-04 DIAGNOSIS — M6281 Muscle weakness (generalized): Secondary | ICD-10-CM | POA: Diagnosis not present

## 2024-05-04 DIAGNOSIS — R262 Difficulty in walking, not elsewhere classified: Secondary | ICD-10-CM

## 2024-05-04 DIAGNOSIS — M79605 Pain in left leg: Secondary | ICD-10-CM | POA: Diagnosis not present

## 2024-05-04 NOTE — Therapy (Signed)
 OUTPATIENT PHYSICAL THERAPY EVALUATION   Patient Name: Jonathon Holmes MRN: [id number] DOB:13-Oct-1985, 38 y.o., male Today's Date: 05/04/2024  END OF SESSION:  PT End of Session - 05/04/24 1427     Visit Number 1    Number of Visits 20    Date for PT Re-Evaluation 07/13/24    Authorization Type [employer] 30 VL 20% coinsurance.    PT Start Time 1432    PT Stop Time 1505    PT Time Calculation (min) 33 min    Activity Tolerance Patient tolerated treatment well    Behavior During Therapy WFL for tasks assessed/performed          Past Medical History:  Diagnosis Date   High cholesterol    Hypertension    History reviewed. No pertinent surgical history. There are no active problems to display for this patient.   PCP: Stacia Millman PA  REFERRING PROVIDER: Trudy Duwaine BRAVO, NP  REFERRING DIAG: [phone] (ICD-10-CM) - Chronic left-sided low back pain with left-sided sciatica M54.16 (ICD-10-CM) - Radiculopathy, lumbar region M51.16 (ICD-10-CM) - Intervertebral disc disorders with radiculopathy, lumbar region  Rationale for Evaluation and Treatment: Rehabilitation  THERAPY DIAG:  Other low back pain  Pain in left leg  Muscle weakness (generalized)  Difficulty in walking, not elsewhere classified  ONSET DATE: 1 to 1.5 years onset  SUBJECTIVE:                                                                                                                                                                                           SUBJECTIVE STATEMENT: Pt indicated history of symptoms seem to be seasonable and variable.  Pt indicated trying to look into trying prevent return of symptoms and response of pain symptoms.   Pt indicated feeling like some symptoms are getting aggravated recently.  Reported symptoms in Lt buttock with tightness as well as some things on Lt side.  Has noted some worsening with colder weather.    Reported some sweating at night with some waking  due to pain symptoms.  Pt indicated symptoms can go down Lt leg to knee but denied numbness/tingling.   Pt denied bowel and bladder symptoms at this time.   PERTINENT HISTORY:  Per referral:  Eval and Treat: Chronic left sided lower back pain/buttock pain. History of left sided disc herniation. Focus on core strengthening and posture  PAIN:  NPRS scale: at worst in last week 2-3/10.   Reported severe at most in past.  Pain location: Lt back/buttock, Lt side Pain description:  sharp, tightness, spasms Aggravating factors: lifting, bending, walking/standing prolonged  Relieving factors: Aleve back and body  PRECAUTIONS: None  WEIGHT BEARING RESTRICTIONS: No  FALLS:  Has patient fallen in last 6 months? No  LIVING ENVIRONMENT: Lives in: House/apartment Stairs: will be a two story house.   OCCUPATION: Works at [employer]  PLOF: Independent, has young child.   PATIENT GOALS: Reduce pain complaints, get stronger and preventive for pain.     OBJECTIVE:   PATIENT SURVEYS:  Patient-Specific Activity Scoring Scheme  0 represents "unable to perform." 10 represents "able to perform at prior level. 0 1 2 3 4 5 6 7 8 9  10 (Date and Score)   Activity Eval  05/04/2024    1. Working with bending  5    2. Lifting and carrying  3    3. Walking upright 0 when bad   4.    5.    Score 2.667    Total score = sum of the activity scores/number of activities Minimum detectable change (90%CI) for average score = 2 points Minimum detectable change (90%CI) for single activity score = 3 points  SCREENING FOR RED FLAGS: 05/04/2024 Bowel or bladder incontinence: No Cauda equina syndrome: No  COGNITION: 05/04/2024 Overall cognitive status: WFL normal      SENSATION: 05/04/2024 [facility]  MUSCLE LENGTH: 05/04/2024 Passive hamstring SLR Lt:  90 deg      Rt:  90 deg   POSTURE:  05/04/2024 Standing posture with equal iliac crest.  Mild reduced lumbar lordosis  PALPATION: 05/04/2024 Trigger  points noted in glute max, med /min on Lt side.   LUMBAR ROM:  05/04/2024 Directional Preference Assessment: Centralization: not observed Peripheralization: not observed  AROM Eval 05/04/2024  Flexion To ankles without change in pain  Extension 75% WFL  With ERP  Repeated  x 5 with improvement   Right lateral flexion   Left lateral flexion   Right rotation   Left rotation    (Blank rows = not tested)  LOWER EXTREMITY ROM:      Right Eval 05/04/2024 Left Eval 05/04/2024  Hip flexion    Hip extension    Hip abduction    Hip adduction    Hip internal rotation    Hip external rotation    Knee flexion    Knee extension    Ankle dorsiflexion    Ankle plantarflexion    Ankle inversion    Ankle eversion     (Blank rows = not tested)  LOWER EXTREMITY MMT:    MMT Right Eval 05/04/2024 Left Eval 05/04/2024  Hip flexion 5/5 5/5  Hip extension    Hip abduction  4/5  Hip adduction    Hip internal rotation    Hip external rotation    Knee flexion 5/5 5/5  Knee extension 4+/5 4+/5  Ankle dorsiflexion 5/5 5/5  Ankle plantarflexion    Ankle inversion    Ankle eversion     (Blank rows = not tested)  SPECIAL TESTS:  05/04/2024 (-) slump, crossed slr   FUNCTIONAL TESTS:  05/04/2024 18 inch chair transfer: without UE assist   GAIT: 05/04/2024 Independent ambulation s deviation today.   Self described at times to be impacted and antalgic when symptoms are bad.  TODAY'S TREATMENT:                                                                                                         DATE: 05/04/2024  Therex:    HEP instruction/performance c cues for techniques, handout provided.  Trial set performed of each for comprehension and symptom assessment.  See below for  exercise list  PATIENT EDUCATION:  05/04/2024 Education details: HEP, POC, DN Person educated: Patient Education method: Explanation, Demonstration, Verbal cues, and Handouts Education comprehension: verbalized understanding, returned demonstration, and verbal cues required  HOME EXERCISE PROGRAM: Access Code: [email or web address] URL: [email or web address] Date: 05/04/2024 Prepared by: Ozell Silvan  Exercises - Standing Lumbar Extension  - 2-3 x daily - 7 x weekly - 1 sets - 5 reps - Supine Figure 4 pull towards  - 2-3 x daily - 7 x weekly - 1 sets - 5 reps - 15-30 hold - Sidelying Lumbar Rotation Stretch  - 2-3 x daily - 7 x weekly - 1 sets - 5 reps - 15 hold - Supine Bridge  - 1-2 x daily - 7 x weekly - 1-2 sets - 10 reps - 2 hold - Standing Hip Hiking  - 1-2 x daily - 7 x weekly - 1 sets - 10 reps - 5 hold  ASSESSMENT:  CLINICAL IMPRESSION: Patient is a 38 y.o. who comes to clinic with complaints of back pain, Lt hip/leg pain with mobility, strength and movement coordination deficits that impair their ability to perform usual daily and recreational functional activities without increase difficulty/symptoms at this time.  Patient to benefit from skilled PT services to address impairments and limitations to improve to previous level of function without restriction secondary to condition.   OBJECTIVE IMPAIRMENTS: Abnormal gait, decreased activity tolerance, decreased coordination, decreased endurance, decreased mobility, difficulty walking, decreased ROM, decreased strength, hypomobility, increased fascial restrictions, impaired perceived functional ability, increased muscle spasms, impaired flexibility, improper body mechanics, postural dysfunction, and pain.   ACTIVITY LIMITATIONS: carrying, lifting, bending, standing, squatting, sleeping, stairs, transfers, reach over head, and locomotion level  PARTICIPATION LIMITATIONS: meal prep, interpersonal relationship, community  activity, and occupation  PERSONAL FACTORS: Time since onset of injury/illness/exacerbation and Medical Hx: High cholesterol, HTN are also affecting patient's functional outcome.   REHAB POTENTIAL: Good  CLINICAL DECISION MAKING: Stable/uncomplicated  EVALUATION COMPLEXITY: Low   GOALS: Goals reviewed with patient? Yes  SHORT TERM GOALS: (target date for Short term goals are 3 weeks 05/25/2024)  1. Patient will demonstrate independent use of home exercise program to maintain progress from in clinic treatments.  Goal status: New  LONG TERM GOALS: (target dates for all long term goals are 10 weeks  07/13/2024 )   1. Patient will demonstrate/report pain at worst less than or equal to 2/10 to facilitate minimal limitation in daily activity secondary to pain symptoms.  Goal status: New   2. Patient will demonstrate independent use of home exercise program to facilitate ability to maintain/progress functional gains from skilled physical therapy services.  Goal status: New   3. Patient will demonstrate Patient specific  functional scale avg > or = 8/10 to indicate reduced disability due to condition.   Goal status: New   4. Patient will demonstrate lumbar extension 100 % WFL s symptoms to facilitate upright standing, walking posture at PLOF s limitation.  Goal status: New   5.  Patient will demonstrate bilateral LE MMT 5/5  to facilitate lift, carry at [employer] for work activity.   Goal status: New   6.  Patient will demonstrate lateral flexion to knee jt s symptoms for usual  mobility from lumbar.  Goal status: New    PLAN:  PT FREQUENCY: 1-2x/week  PT DURATION: 10 weeks  PLANNED INTERVENTIONS: Can include 02853- PT Re-evaluation, 97110-Therapeutic exercises, 97530- Therapeutic activity, [id number]- Neuromuscular re-education, 97535- Self Care, 97140- Manual therapy, [phone]- Gait training, [phone]- Orthotic Fit/training, [phone]- Canalith repositioning, [id number]- Aquatic Therapy, [id number]-  Electrical stimulation (unattended), [id number] Physical performance testing, 97016- Vasopneumatic device, [id number]- Ultrasound, [id number]- Traction (mechanical), [id number]- Ionotophoresis 4mg /ml Dexamethasone,  20560 - Needle insertion w/o injection 1 or 2 muscles, 20561 - Needle insertion w/o injection 3 or more muscles.    Patient/Family education, Balance training, Stair training, Taping, Dry Needling, Joint mobilization, Joint manipulation, Spinal manipulation, Spinal mobilization, Scar mobilization, Vestibular training, Visual/preceptual remediation/compensation, DME instructions, Cryotherapy, and Moist heat.  All performed as medically necessary.  All included unless contraindicated  PLAN FOR NEXT SESSION: Review HEP knowledge/results.  Addition of core stability, quad strengthening with SLR.   Educated on dry needling for use when symptoms are more noted, may not   Ozell Silvan, PT, DPT, OCS, ATC 05/04/24  3:13 PM

## 2024-05-24 ENCOUNTER — Telehealth: Payer: Self-pay | Admitting: Rehabilitative and Restorative Service Providers"

## 2024-05-24 ENCOUNTER — Encounter: Admitting: Rehabilitative and Restorative Service Providers"

## 2024-05-24 NOTE — Telephone Encounter (Signed)
 Called patient after 15 mins no show for appointment today.  Left message and reminder of next appointment time, as well as call back number.   Ozell Silvan, PT, DPT, OCS, ATC 05/24/24  2:07 PM

## 2024-05-24 NOTE — Therapy (Incomplete)
 OUTPATIENT PHYSICAL THERAPY TREATMENT   Patient Name: Joshua Soulier MRN: [id number] DOB:1986-08-01, 38 y.o., male Today's Date: 05/24/2024  END OF SESSION:    Past Medical History:  Diagnosis Date   High cholesterol    Hypertension    No past surgical history on file. There are no active problems to display for this patient.   PCP: Stacia Millman PA  REFERRING PROVIDER: Trudy Duwaine BRAVO, NP  REFERRING DIAG: [phone] (ICD-10-CM) - Chronic left-sided low back pain with left-sided sciatica M54.16 (ICD-10-CM) - Radiculopathy, lumbar region M51.16 (ICD-10-CM) - Intervertebral disc disorders with radiculopathy, lumbar region  Rationale for Evaluation and Treatment: Rehabilitation  THERAPY DIAG:  No diagnosis found.  ONSET DATE: 1 to 1.5 years onset  SUBJECTIVE:                                                                                                                                                                                           SUBJECTIVE STATEMENT: Pt indicated history of symptoms seem to be seasonable and variable.  Pt indicated trying to look into trying prevent return of symptoms and response of pain symptoms.   Pt indicated feeling like some symptoms are getting aggravated recently.  Reported symptoms in Lt buttock with tightness as well as some things on Lt side.  Has noted some worsening with colder weather.    Reported some sweating at night with some waking due to pain symptoms.  Pt indicated symptoms can go down Lt leg to knee but denied numbness/tingling.   Pt denied bowel and bladder symptoms at this time.   PERTINENT HISTORY:  Per referral:  Eval and Treat: Chronic left sided lower back pain/buttock pain. History of left sided disc herniation. Focus on core strengthening and posture  PAIN:  NPRS scale: at worst in last week 2-3/10.   Reported severe at most in past.  Pain location: Lt back/buttock, Lt side Pain description:  sharp,  tightness, spasms Aggravating factors: lifting, bending, walking/standing prolonged  Relieving factors: Aleve back and body  PRECAUTIONS: None  WEIGHT BEARING RESTRICTIONS: No  FALLS:  Has patient fallen in last 6 months? No  LIVING ENVIRONMENT: Lives in: House/apartment Stairs: will be a two story house.   OCCUPATION: Works at [employer]  PLOF: Independent, has young child.   PATIENT GOALS: Reduce pain complaints, get stronger and preventive for pain.     OBJECTIVE:   PATIENT SURVEYS:  Patient-Specific Activity Scoring Scheme  0 represents "unable to perform." 10 represents "able to perform at prior level. 0 1 2 3 4 5 6 7 8 9  10 (Date and Score)   Activity Eval  05/04/2024  1. Working with bending  5    2. Lifting and carrying  3    3. Walking upright 0 when bad   4.    5.    Score 2.667    Total score = sum of the activity scores/number of activities Minimum detectable change (90%CI) for average score = 2 points Minimum detectable change (90%CI) for single activity score = 3 points  SCREENING FOR RED FLAGS: 05/04/2024 Bowel or bladder incontinence: No Cauda equina syndrome: No  COGNITION: 05/04/2024 Overall cognitive status: WFL normal      SENSATION: 05/04/2024 [facility]  MUSCLE LENGTH: 05/04/2024 Passive hamstring SLR Lt:  90 deg      Rt:  90 deg   POSTURE:  05/04/2024 Standing posture with equal iliac crest.  Mild reduced lumbar lordosis  PALPATION: 05/04/2024 Trigger points noted in glute max, med /min on Lt side.   LUMBAR ROM:  05/04/2024 Directional Preference Assessment: Centralization: not observed Peripheralization: not observed  AROM Eval 05/04/2024  Flexion To ankles without change in pain  Extension 75% WFL  With ERP  Repeated  x 5 with improvement   Right lateral flexion   Left lateral flexion   Right rotation   Left rotation    (Blank rows = not tested)  LOWER EXTREMITY ROM:      Right Eval 05/04/2024  Left Eval 05/04/2024  Hip flexion    Hip extension    Hip abduction    Hip adduction    Hip internal rotation    Hip external rotation    Knee flexion    Knee extension    Ankle dorsiflexion    Ankle plantarflexion    Ankle inversion    Ankle eversion     (Blank rows = not tested)  LOWER EXTREMITY MMT:    MMT Right Eval 05/04/2024 Left Eval 05/04/2024  Hip flexion 5/5 5/5  Hip extension    Hip abduction  4/5  Hip adduction    Hip internal rotation    Hip external rotation    Knee flexion 5/5 5/5  Knee extension 4+/5 4+/5  Ankle dorsiflexion 5/5 5/5  Ankle plantarflexion    Ankle inversion    Ankle eversion     (Blank rows = not tested)  SPECIAL TESTS:  05/04/2024 (-) slump, crossed slr   FUNCTIONAL TESTS:  05/04/2024 18 inch chair transfer: without UE assist   GAIT: 05/04/2024 Independent ambulation s deviation today.   Self described at times to be impacted and antalgic when symptoms are bad.  TODAY'S TREATMENT:                                                                                                         DATE: 05/24/2024  Therex:    TODAY'S TREATMENT:                                                                                                         DATE: 05/04/2024  Therex:    HEP instruction/performance c cues for techniques, handout provided.  Trial set performed of each for comprehension and symptom assessment.  See below for exercise list  PATIENT EDUCATION:  05/04/2024 Education details: HEP, POC, DN Person educated: Patient Education method: Explanation, Demonstration, Verbal cues, and Handouts Education comprehension: verbalized understanding, returned demonstration, and verbal cues required  HOME EXERCISE PROGRAM: Access Code:  [email or web address] URL: [email or web address] Date: 05/04/2024 Prepared by: Ozell Silvan  Exercises - Standing Lumbar Extension  - 2-3 x daily - 7 x weekly - 1 sets - 5 reps - Supine Figure 4 pull towards  - 2-3 x daily - 7 x weekly - 1 sets - 5 reps - 15-30 hold - Sidelying Lumbar Rotation Stretch  - 2-3 x daily - 7 x weekly - 1 sets - 5 reps - 15 hold - Supine Bridge  - 1-2 x daily - 7 x weekly - 1-2 sets - 10 reps - 2 hold - Standing Hip Hiking  - 1-2 x daily - 7 x weekly - 1 sets - 10 reps - 5 hold  ASSESSMENT:  CLINICAL IMPRESSION: Patient is a 38 y.o. who comes to clinic with complaints of back pain, Lt hip/leg pain with mobility, strength and movement coordination deficits that impair their ability to perform usual daily and recreational functional activities without increase difficulty/symptoms at this time.  Patient to benefit from skilled PT services to address impairments and limitations to improve to previous level of function without restriction secondary to condition.   OBJECTIVE IMPAIRMENTS: Abnormal gait, decreased activity tolerance, decreased coordination, decreased endurance, decreased mobility, difficulty walking, decreased ROM, decreased strength, hypomobility, increased fascial restrictions, impaired perceived functional ability, increased muscle spasms, impaired flexibility, improper body mechanics, postural dysfunction, and pain.   ACTIVITY LIMITATIONS: carrying, lifting, bending, standing, squatting, sleeping, stairs, transfers, reach over head, and locomotion level  PARTICIPATION LIMITATIONS: meal prep, interpersonal relationship, community activity, and occupation  PERSONAL FACTORS: Time since onset of injury/illness/exacerbation and Medical Hx: High cholesterol, HTN are also affecting patient's functional outcome.   REHAB POTENTIAL: Good  CLINICAL DECISION MAKING: Stable/uncomplicated  EVALUATION COMPLEXITY: Low   GOALS: Goals reviewed with patient?  Yes  SHORT TERM GOALS: (target date  for Short term goals are 3 weeks 05/25/2024)  1. Patient will demonstrate independent use of home exercise program to maintain progress from in clinic treatments.  Goal status: Met   LONG TERM GOALS: (target dates for all long term goals are 10 weeks  07/13/2024 )   1. Patient will demonstrate/report pain at worst less than or equal to 2/10 to facilitate minimal limitation in daily activity secondary to pain symptoms.  Goal status: on going 05/24/2024   2. Patient will demonstrate independent use of home exercise program to facilitate ability to maintain/progress functional gains from skilled physical therapy services.  Goal status: on going 05/24/2024   3. Patient will demonstrate Patient specific functional scale avg > or = 8/10 to indicate reduced disability due to condition.   Goal status: on going 05/24/2024   4. Patient will demonstrate lumbar extension 100 % WFL s symptoms to facilitate upright standing, walking posture at PLOF s limitation.  Goal status: on going 05/24/2024   5.  Patient will demonstrate bilateral LE MMT 5/5  to facilitate lift, carry at [facility] for work activity.   Goal status: on going 05/24/2024   6.  Patient will demonstrate lateral flexion to knee jt s symptoms for usual  mobility from lumbar.  Goal status: on going 05/24/2024    PLAN:  PT FREQUENCY: 1-2x/week  PT DURATION: 10 weeks  PLANNED INTERVENTIONS: Can include 02853- PT Re-evaluation, 97110-Therapeutic exercises, 97530- Therapeutic activity, 97112- Neuromuscular re-education, 97535- Self Care, 97140- Manual therapy, [phone]- Gait training, [phone]- Orthotic Fit/training, [phone]- Canalith repositioning, [id number]- Aquatic Therapy, [phone]- Electrical stimulation (unattended), [id number] Physical performance testing, 97016- Vasopneumatic device, [id number]- Ultrasound, [id number]- Traction (mechanical), [id number]- Ionotophoresis 4mg /ml Dexamethasone,  20560 - Needle insertion w/o injection 1 or 2  muscles, 20561 - Needle insertion w/o injection 3 or more muscles.    Patient/Family education, Balance training, Stair training, Taping, Dry Needling, Joint mobilization, Joint manipulation, Spinal manipulation, Spinal mobilization, Scar mobilization, Vestibular training, Visual/preceptual remediation/compensation, DME instructions, Cryotherapy, and Moist heat.  All performed as medically necessary.  All included unless contraindicated  PLAN FOR NEXT SESSION: Review HEP knowledge/results.  Addition of core stability, quad strengthening with SLR.   Educated on dry needling for use when symptoms are more noted, may not   Ozell Silvan, PT, DPT, OCS, ATC 05/24/24  8:08 AM

## 2024-05-31 ENCOUNTER — Encounter: Payer: Self-pay | Admitting: Rehabilitative and Restorative Service Providers"

## 2024-05-31 ENCOUNTER — Ambulatory Visit (INDEPENDENT_AMBULATORY_CARE_PROVIDER_SITE_OTHER): Admitting: Rehabilitative and Restorative Service Providers"

## 2024-05-31 DIAGNOSIS — M6281 Muscle weakness (generalized): Secondary | ICD-10-CM

## 2024-05-31 DIAGNOSIS — R262 Difficulty in walking, not elsewhere classified: Secondary | ICD-10-CM | POA: Diagnosis not present

## 2024-05-31 DIAGNOSIS — M79605 Pain in left leg: Secondary | ICD-10-CM

## 2024-05-31 DIAGNOSIS — M5459 Other low back pain: Secondary | ICD-10-CM

## 2024-05-31 NOTE — Therapy (Addendum)
 OUTPATIENT PHYSICAL THERAPY TREATMENT / DISCHARGE   Patient Name: Jonathon Holmes MRN: [id number] DOB:1986/08/29, 38 y.o., male Today's Date: 05/31/2024  END OF SESSION:  PT End of Session - 05/31/24 1418     Visit Number 2    Number of Visits 20    Date for Recertification  07/13/24    Authorization Type [employer] 30 VL 20% coinsurance.    PT Start Time 1343    PT Stop Time 1406    PT Time Calculation (min) 23 min    Activity Tolerance Patient tolerated treatment well    Behavior During Therapy WFL for tasks assessed/performed           Past Medical History:  Diagnosis Date   High cholesterol    Hypertension    History reviewed. No pertinent surgical history. There are no active problems to display for this patient.   PCP: Stacia Millman PA  REFERRING PROVIDER: Trudy Duwaine BRAVO, NP  REFERRING DIAG: [phone] (ICD-10-CM) - Chronic left-sided low back pain with left-sided sciatica M54.16 (ICD-10-CM) - Radiculopathy, lumbar region M51.16 (ICD-10-CM) - Intervertebral disc disorders with radiculopathy, lumbar region  Rationale for Evaluation and Treatment: Rehabilitation  THERAPY DIAG:  Other low back pain  Pain in left leg  Muscle weakness (generalized)  Difficulty in walking, not elsewhere classified  ONSET DATE: 1 to 1.5 years onset  SUBJECTIVE:                                                                                                                                                                                           SUBJECTIVE STATEMENT: Pt indicated missing last appointment due to thinking it was later.   Pt indicated having some variable symptoms in that Lt side.    PERTINENT HISTORY:  Per referral:  Eval and Treat: Chronic left sided lower back pain/buttock pain. History of left sided disc herniation. Focus on core strengthening and posture  PAIN:  NPRS scale: at worst in last 3/10 Pain location: Lt back/buttock, Lt side Pain  description:  sharp, tightness, spasms Aggravating factors: lifting, bending, walking/standing prolonged  Relieving factors: Aleve back and body  PRECAUTIONS: None  WEIGHT BEARING RESTRICTIONS: No  FALLS:  Has patient fallen in last 6 months? No  LIVING ENVIRONMENT: Lives in: House/apartment Stairs: will be a two story house.   OCCUPATION: Works at [employer]  PLOF: Independent, has young child.   PATIENT GOALS: Reduce pain complaints, get stronger and preventive for pain.     OBJECTIVE:   PATIENT SURVEYS:  Patient-Specific Activity Scoring Scheme  0 represents "unable to perform." 10 represents "able to perform at prior level. 0 1 2  3 4 5 6 7 8 9 10  (Date and Score)   Activity Eval  05/04/2024  05/31/2024  1. Working with bending  5  10  2. Lifting and carrying  3  10  3. Walking upright 0 when bad 10  4.    5.    Score 2.667 10   Total score = sum of the activity scores/number of activities Minimum detectable change (90%CI) for average score = 2 points Minimum detectable change (90%CI) for single activity score = 3 points  SCREENING FOR RED FLAGS: 05/04/2024 Bowel or bladder incontinence: No Cauda equina syndrome: No  COGNITION: 05/04/2024 Overall cognitive status: WFL normal      SENSATION: 05/04/2024 [facility]  MUSCLE LENGTH: 05/04/2024 Passive hamstring SLR Lt:  90 deg      Rt:  90 deg   POSTURE:  05/04/2024 Standing posture with equal iliac crest.  Mild reduced lumbar lordosis  PALPATION: 05/04/2024 Trigger points noted in glute max, med /min on Lt side.   LUMBAR ROM:  05/04/2024 Directional Preference Assessment: Centralization: not observed Peripheralization: not observed  AROM Eval 05/04/2024 05/31/2024  Flexion To ankles without change in pain   Extension 75% WFL  With ERP  Repeated  x 5 with improvement  100% WFL   Right lateral flexion    Left lateral flexion    Right rotation    Left rotation     (Blank rows = not tested)  LOWER  EXTREMITY ROM:      Right Eval 05/04/2024 Left Eval 05/04/2024  Hip flexion    Hip extension    Hip abduction    Hip adduction    Hip internal rotation    Hip external rotation    Knee flexion    Knee extension    Ankle dorsiflexion    Ankle plantarflexion    Ankle inversion    Ankle eversion     (Blank rows = not tested)  LOWER EXTREMITY MMT:    MMT Right Eval 05/04/2024 Left Eval 05/04/2024  Hip flexion 5/5 5/5  Hip extension    Hip abduction  4/5  Hip adduction    Hip internal rotation    Hip external rotation    Knee flexion 5/5 5/5  Knee extension 4+/5 4+/5  Ankle dorsiflexion 5/5 5/5  Ankle plantarflexion    Ankle inversion    Ankle eversion     (Blank rows = not tested)  SPECIAL TESTS:  05/04/2024 (-) slump, crossed slr   FUNCTIONAL TESTS:  05/04/2024 18 inch chair transfer: without UE assist   GAIT: 05/04/2024 Independent ambulation s deviation today.   Self described at times to be impacted and antalgic when symptoms are bad.  TODAY'S TREATMENT:                                                                                                         DATE: 05/24/2024  Therex: Verbal review of existing HEP.  Performed new intervention and sent text message with information for updates in HEP . Prone arm/leg lift opposite 3 sec hold x 10 bilateral Quadruped arm/leg lift 3 sec hold x 10 bilateral  Manual Percussive device to Lt QL, lumbar paraspinals. Lt glute med/min/max      TODAY'S TREATMENT:                                                                                                         DATE: 05/04/2024  Therex:    HEP instruction/performance c cues for techniques, handout provided.  Trial set performed of each for comprehension and  symptom assessment.  See below for exercise list  PATIENT EDUCATION:  05/31/2024 Education details: HEP, POC, DN Person educated: Patient Education method: Explanation, Demonstration, Verbal cues, and Handouts Education comprehension: verbalized understanding, returned demonstration, and verbal cues required  HOME EXERCISE PROGRAM: Access Code: [email or web address] URL: [email or web address] Date: 05/31/2024 Prepared by: Ozell Silvan  Exercises - Standing Lumbar Extension  - 2-3 x daily - 7 x weekly - 1 sets - 5 reps - Supine Figure 4 pull towards  - 2-3 x daily - 7 x weekly - 1 sets - 5 reps - 15-30 hold - Sidelying Lumbar Rotation Stretch  - 2-3 x daily - 7 x weekly - 1 sets - 5 reps - 15 hold - Supine Bridge  - 1-2 x daily - 7 x weekly - 1-2 sets - 10 reps - 2 hold - Standing Hip Hiking  - 1-2 x daily - 7 x weekly - 1 sets - 10 reps - 5 hold - Prone Alternating Arm and Leg Lifts  - 1-2 x daily - 7 x weekly - 1-2 sets - 10-15 reps - 3 hold - Bird Dog  - 1-2 x daily - 7 x weekly - 1-2 sets - 10-15 reps - 3 hold  ASSESSMENT:  CLINICAL IMPRESSION: Lumbar mobility showed improvement today in extension compared to evaluation.  Percussive device seemed helpful for use in clinic for mild symptoms indicated upon arrival.  After discussion with patient today, plan for return in approx. 3 weeks after moving houses to see how symptoms are doing.   Treatment time impacted by patient having to perform caregiving duties for infant child.   OBJECTIVE IMPAIRMENTS: Abnormal gait, decreased activity tolerance, decreased coordination, decreased endurance, decreased mobility, difficulty walking, decreased ROM, decreased strength, hypomobility, increased fascial restrictions,  impaired perceived functional ability, increased muscle spasms, impaired flexibility, improper body mechanics, postural dysfunction, and pain.   ACTIVITY LIMITATIONS: carrying, lifting, bending, standing, squatting, sleeping,  stairs, transfers, reach over head, and locomotion level  PARTICIPATION LIMITATIONS: meal prep, interpersonal relationship, community activity, and occupation  PERSONAL FACTORS: Time since onset of injury/illness/exacerbation and Medical Hx: High cholesterol, HTN are also affecting patient's functional outcome.   REHAB POTENTIAL: Good  CLINICAL DECISION MAKING: Stable/uncomplicated  EVALUATION COMPLEXITY: Low   GOALS: Goals reviewed with patient? Yes  SHORT TERM GOALS: (target date for Short term goals are 3 weeks 05/25/2024)  1. Patient will demonstrate independent use of home exercise program to maintain progress from in clinic treatments.  Goal status: Met   LONG TERM GOALS: (target dates for all long term goals are 10 weeks  07/13/2024 )   1. Patient will demonstrate/report pain at worst less than or equal to 2/10 to facilitate minimal limitation in daily activity secondary to pain symptoms.  Goal status: on going 05/31/2024   2. Patient will demonstrate independent use of home exercise program to facilitate ability to maintain/progress functional gains from skilled physical therapy services.  Goal status: on going 05/31/2024   3. Patient will demonstrate Patient specific functional scale avg > or = 8/10 to indicate reduced disability due to condition.   Goal status: on going 05/31/2024   4. Patient will demonstrate lumbar extension 100 % WFL s symptoms to facilitate upright standing, walking posture at PLOF s limitation.  Goal status: Met 05/31/2024   5.  Patient will demonstrate bilateral LE MMT 5/5  to facilitate lift, carry at [facility] for work activity.   Goal status: on going 05/31/2024   6.  Patient will demonstrate lateral flexion to knee jt s symptoms for usual  mobility from lumbar.  Goal status: on going 05/31/2024    PLAN:  PT FREQUENCY: 1-2x/week  PT DURATION: 10 weeks  PLANNED INTERVENTIONS: Can include 02853- PT Re-evaluation, 97110-Therapeutic exercises,  97530- Therapeutic activity, 97112- Neuromuscular re-education, 97535- Self Care, 97140- Manual therapy, [phone]- Gait training, [phone]- Orthotic Fit/training, [phone]- Canalith repositioning, [id number]- Aquatic Therapy, [phone]- Electrical stimulation (unattended), [id number] Physical performance testing, 97016- Vasopneumatic device, [id number]- Ultrasound, [id number]- Traction (mechanical), [id number]- Ionotophoresis 4mg /ml Dexamethasone,  20560 - Needle insertion w/o injection 1 or 2 muscles, 20561 - Needle insertion w/o injection 3 or more muscles.    Patient/Family education, Balance training, Stair training, Taping, Dry Needling, Joint mobilization, Joint manipulation, Spinal manipulation, Spinal mobilization, Scar mobilization, Vestibular training, Visual/preceptual remediation/compensation, DME instructions, Cryotherapy, and Moist heat.  All performed as medically necessary.  All included unless contraindicated  PLAN FOR NEXT SESSION: Check on response to moving.   Ozell Silvan, PT, DPT, OCS, ATC 05/31/24  2:21 PM   PHYSICAL THERAPY DISCHARGE SUMMARY  Visits from Start of Care: 2  Current functional level related to goals / functional outcomes: See note   Remaining deficits: See note   Education / Equipment: HEP  Patient goals were partially met. Patient is being discharged due to not returning since the last visit.   Ozell Silvan, PT, DPT, OCS, ATC 07/13/24  12:56 PM

## 2024-06-07 ENCOUNTER — Encounter: Admitting: Rehabilitative and Restorative Service Providers"

## 2024-06-22 ENCOUNTER — Telehealth: Payer: Self-pay | Admitting: Rehabilitative and Restorative Service Providers"

## 2024-06-22 ENCOUNTER — Encounter: Admitting: Rehabilitative and Restorative Service Providers"

## 2024-06-22 NOTE — Telephone Encounter (Signed)
 Called patient after 15 mins no show for appointment today.  Left message with call back number.  No additional visits scheduled at this time.   Pt has 2 no shows in treatment cycle.    Ozell Silvan, PT, DPT, OCS, ATC 06/22/24  12:06 PM

## 2024-06-22 NOTE — Therapy (Incomplete)
 OUTPATIENT PHYSICAL THERAPY TREATMENT   Patient Name: Jonathon Holmes MRN: [id number] DOB:03/19/1986, 38 y.o., male Today's Date: 06/22/2024  END OF SESSION:     Past Medical History:  Diagnosis Date   High cholesterol    Hypertension    No past surgical history on file. There are no active problems to display for this patient.   PCP: Stacia Millman PA  REFERRING PROVIDER: Trudy Duwaine BRAVO, NP  REFERRING DIAG: [phone] (ICD-10-CM) - Chronic left-sided low back pain with left-sided sciatica M54.16 (ICD-10-CM) - Radiculopathy, lumbar region M51.16 (ICD-10-CM) - Intervertebral disc disorders with radiculopathy, lumbar region  Rationale for Evaluation and Treatment: Rehabilitation  THERAPY DIAG:  No diagnosis found.  ONSET DATE: 1 to 1.5 years onset  SUBJECTIVE:                                                                                                                                                                                           SUBJECTIVE STATEMENT: Pt indicated missing last appointment due to thinking it was later.   Pt indicated having some variable symptoms in that Lt side.    PERTINENT HISTORY:  Per referral:  Eval and Treat: Chronic left sided lower back pain/buttock pain. History of left sided disc herniation. Focus on core strengthening and posture  PAIN:  NPRS scale: at worst in last 3/10 Pain location: Lt back/buttock, Lt side Pain description:  sharp, tightness, spasms Aggravating factors: lifting, bending, walking/standing prolonged  Relieving factors: Aleve back and body  PRECAUTIONS: None  WEIGHT BEARING RESTRICTIONS: No  FALLS:  Has patient fallen in last 6 months? No  LIVING ENVIRONMENT: Lives in: House/apartment Stairs: will be a two story house.   OCCUPATION: Works at [employer]  PLOF: Independent, has young child.   PATIENT GOALS: Reduce pain complaints, get stronger and preventive for pain.     OBJECTIVE:    PATIENT SURVEYS:  Patient-Specific Activity Scoring Scheme  0 represents "unable to perform." 10 represents "able to perform at prior level. 0 1 2 3 4 5 6 7 8 9  10 (Date and Score)   Activity Eval  05/04/2024  05/31/2024  1. Working with bending  5  10  2. Lifting and carrying  3  10  3. Walking upright 0 when bad 10  4.    5.    Score 2.667 10   Total score = sum of the activity scores/number of activities Minimum detectable change (90%CI) for average score = 2 points Minimum detectable change (90%CI) for single activity score = 3 points  SCREENING FOR RED FLAGS: 05/04/2024 Bowel or bladder incontinence: No Cauda equina syndrome: No  COGNITION:  05/04/2024 Overall cognitive status: WFL normal      SENSATION: 05/04/2024 [facility]  MUSCLE LENGTH: 05/04/2024 Passive hamstring SLR Lt:  90 deg      Rt:  90 deg   POSTURE:  05/04/2024 Standing posture with equal iliac crest.  Mild reduced lumbar lordosis  PALPATION: 05/04/2024 Trigger points noted in glute max, med /min on Lt side.   LUMBAR ROM:  05/04/2024 Directional Preference Assessment: Centralization: not observed Peripheralization: not observed  AROM Eval 05/04/2024 05/31/2024  Flexion To ankles without change in pain   Extension 75% WFL  With ERP  Repeated  x 5 with improvement  100% WFL   Right lateral flexion    Left lateral flexion    Right rotation    Left rotation     (Blank rows = not tested)  LOWER EXTREMITY ROM:      Right Eval 05/04/2024 Left Eval 05/04/2024  Hip flexion    Hip extension    Hip abduction    Hip adduction    Hip internal rotation    Hip external rotation    Knee flexion    Knee extension    Ankle dorsiflexion    Ankle plantarflexion    Ankle inversion    Ankle eversion     (Blank rows = not tested)  LOWER EXTREMITY MMT:    MMT Right Eval 05/04/2024 Left Eval 05/04/2024  Hip flexion 5/5 5/5  Hip extension    Hip abduction  4/5  Hip adduction    Hip internal  rotation    Hip external rotation    Knee flexion 5/5 5/5  Knee extension 4+/5 4+/5  Ankle dorsiflexion 5/5 5/5  Ankle plantarflexion    Ankle inversion    Ankle eversion     (Blank rows = not tested)  SPECIAL TESTS:  05/04/2024 (-) slump, crossed slr   FUNCTIONAL TESTS:  05/04/2024 18 inch chair transfer: without UE assist   GAIT: 05/04/2024 Independent ambulation s deviation today.   Self described at times to be impacted and antalgic when symptoms are bad.                                                                                                                                                                                                                   TODAY'S TREATMENT:  DATE: 06/22/2024  Therex: Verbal review of existing HEP.  Performed new intervention and sent text message with information for updates in HEP . Prone arm/leg lift opposite 3 sec hold x 10 bilateral Quadruped arm/leg lift 3 sec hold x 10 bilateral  Manual Percussive device to Lt QL, lumbar paraspinals. Lt glute med/min/max  TODAY'S TREATMENT:                                                                                                         DATE: 05/24/2024  Therex: Verbal review of existing HEP.  Performed new intervention and sent text message with information for updates in HEP . Prone arm/leg lift opposite 3 sec hold x 10 bilateral Quadruped arm/leg lift 3 sec hold x 10 bilateral  Manual Percussive device to Lt QL, lumbar paraspinals. Lt glute med/min/max      TODAY'S TREATMENT:                                                                                                         DATE: 05/04/2024  Therex:    HEP instruction/performance c cues for techniques, handout provided.  Trial set performed of each for comprehension and symptom assessment.  See below for exercise list  PATIENT  EDUCATION:  05/31/2024 Education details: HEP, POC, DN Person educated: Patient Education method: Explanation, Demonstration, Verbal cues, and Handouts Education comprehension: verbalized understanding, returned demonstration, and verbal cues required  HOME EXERCISE PROGRAM: Access Code: [email or web address] URL: [email or web address] Date: 05/31/2024 Prepared by: Ozell Silvan  Exercises - Standing Lumbar Extension  - 2-3 x daily - 7 x weekly - 1 sets - 5 reps - Supine Figure 4 pull towards  - 2-3 x daily - 7 x weekly - 1 sets - 5 reps - 15-30 hold - Sidelying Lumbar Rotation Stretch  - 2-3 x daily - 7 x weekly - 1 sets - 5 reps - 15 hold - Supine Bridge  - 1-2 x daily - 7 x weekly - 1-2 sets - 10 reps - 2 hold - Standing Hip Hiking  - 1-2 x daily - 7 x weekly - 1 sets - 10 reps - 5 hold - Prone Alternating Arm and Leg Lifts  - 1-2 x daily - 7 x weekly - 1-2 sets - 10-15 reps - 3 hold - Bird Dog  - 1-2 x daily - 7 x weekly - 1-2 sets - 10-15 reps - 3 hold  ASSESSMENT:  CLINICAL IMPRESSION: Lumbar mobility showed improvement today in extension compared to evaluation.  Percussive device seemed helpful for use in clinic for mild symptoms indicated upon arrival.  After discussion with patient today,  plan for return in approx. 3 weeks after moving houses to see how symptoms are doing.   Treatment time impacted by patient having to perform caregiving duties for infant child.   OBJECTIVE IMPAIRMENTS: Abnormal gait, decreased activity tolerance, decreased coordination, decreased endurance, decreased mobility, difficulty walking, decreased ROM, decreased strength, hypomobility, increased fascial restrictions, impaired perceived functional ability, increased muscle spasms, impaired flexibility, improper body mechanics, postural dysfunction, and pain.   ACTIVITY LIMITATIONS: carrying, lifting, bending, standing, squatting, sleeping, stairs, transfers, reach over head, and locomotion  level  PARTICIPATION LIMITATIONS: meal prep, interpersonal relationship, community activity, and occupation  PERSONAL FACTORS: Time since onset of injury/illness/exacerbation and Medical Hx: High cholesterol, HTN are also affecting patient's functional outcome.   REHAB POTENTIAL: Good  CLINICAL DECISION MAKING: Stable/uncomplicated  EVALUATION COMPLEXITY: Low   GOALS: Goals reviewed with patient? Yes  SHORT TERM GOALS: (target date for Short term goals are 3 weeks 05/25/2024)  1. Patient will demonstrate independent use of home exercise program to maintain progress from in clinic treatments.  Goal status: Met   LONG TERM GOALS: (target dates for all long term goals are 10 weeks  07/13/2024 )   1. Patient will demonstrate/report pain at worst less than or equal to 2/10 to facilitate minimal limitation in daily activity secondary to pain symptoms.  Goal status: on going 05/31/2024   2. Patient will demonstrate independent use of home exercise program to facilitate ability to maintain/progress functional gains from skilled physical therapy services.  Goal status: on going 05/31/2024   3. Patient will demonstrate Patient specific functional scale avg > or = 8/10 to indicate reduced disability due to condition.   Goal status: on going 05/31/2024   4. Patient will demonstrate lumbar extension 100 % WFL s symptoms to facilitate upright standing, walking posture at PLOF s limitation.  Goal status: Met 05/31/2024   5.  Patient will demonstrate bilateral LE MMT 5/5  to facilitate lift, carry at [facility] for work activity.   Goal status: on going 05/31/2024   6.  Patient will demonstrate lateral flexion to knee jt s symptoms for usual  mobility from lumbar.  Goal status: on going 05/31/2024    PLAN:  PT FREQUENCY: 1-2x/week  PT DURATION: 10 weeks  PLANNED INTERVENTIONS: Can include 02853- PT Re-evaluation, 97110-Therapeutic exercises, 97530- Therapeutic activity, 97112- Neuromuscular  re-education, 97535- Self Care, 97140- Manual therapy, [phone]- Gait training, [phone]- Orthotic Fit/training, [phone]- Canalith repositioning, [id number]- Aquatic Therapy, [phone]- Electrical stimulation (unattended), [id number] Physical performance testing, 97016- Vasopneumatic device, [id number]- Ultrasound, [id number]- Traction (mechanical), [id number]- Ionotophoresis 4mg /ml Dexamethasone,  20560 - Needle insertion w/o injection 1 or 2 muscles, 20561 - Needle insertion w/o injection 3 or more muscles.    Patient/Family education, Balance training, Stair training, Taping, Dry Needling, Joint mobilization, Joint manipulation, Spinal manipulation, Spinal mobilization, Scar mobilization, Vestibular training, Visual/preceptual remediation/compensation, DME instructions, Cryotherapy, and Moist heat.  All performed as medically necessary.  All included unless contraindicated  PLAN FOR NEXT SESSION: Check on response to moving.   Ozell Silvan, PT, DPT, OCS, ATC 06/22/24  9:14 AM

## 2024-07-12 ENCOUNTER — Encounter: Payer: Self-pay | Admitting: Radiology

## 2024-07-16 ENCOUNTER — Encounter: Payer: Self-pay | Admitting: Physical Medicine and Rehabilitation

## 2024-07-16 ENCOUNTER — Ambulatory Visit: Admitting: Physical Medicine and Rehabilitation

## 2024-07-16 DIAGNOSIS — M5116 Intervertebral disc disorders with radiculopathy, lumbar region: Secondary | ICD-10-CM | POA: Diagnosis not present

## 2024-07-16 DIAGNOSIS — G8929 Other chronic pain: Secondary | ICD-10-CM | POA: Diagnosis not present

## 2024-07-16 DIAGNOSIS — M5416 Radiculopathy, lumbar region: Secondary | ICD-10-CM

## 2024-07-16 NOTE — Progress Notes (Signed)
 Jonathon Holmes - 38 y.o. male MRN [id number]  Date of birth: 11-15-1985  Office Visit Note: Visit Date: 07/16/2024 PCP: Stacia Millman, PA Referred by: Stacia Millman, PA  Subjective: Chief Complaint  Patient presents with   Lower Back - Pain   HPI: Jonathon Holmes is a 38 y.o. male who comes in today for evaluation of chronic left sided lower back pain radiating to buttock and down lateral aspect of left leg. Pain ongoing since 38 years of age. His pain worsened over the last week. He describes pain as sharp and pulsating, currently rates as 1 out of 10. Some relief of pain with home exercise regimen, rest and use of medications. He reports some relief of pain with ongoing formal physical therapy, however his referral has expired. Lumbar MRI imaging from July of this year shows substantial left lateral recess disc protrusion contributes to left-sided impingement at the L4-L5 level. No high grade spinal canal stenosis. No history of lumbar surgery/injections. Patient is currently working as academic librarian with [employer], he is gearing up for busy Christmas season. Patient denies focal weakness, numbness and tingling. No recent trauma or falls.      Review of Systems  Musculoskeletal:  Positive for back pain.  Neurological:  Negative for tingling, sensory change, focal weakness and weakness.  All other systems reviewed and are negative.  Otherwise per HPI.  Assessment & Plan: Visit Diagnoses:    ICD-10-CM   1. Chronic left-sided low back pain with left-sided sciatica  M54.42 Ambulatory referral to Physical Therapy   G89.29     2. Radiculopathy, lumbar region  M54.16 Ambulatory referral to Physical Therapy    3. Intervertebral disc disorders with radiculopathy, lumbar region  M51.16 Ambulatory referral to Physical Therapy       Plan: Findings:  Chronic left sided lower back pain radiating to buttock and down lateral aspect of left leg. He continues to have mild pain at this time.  His pain does seem to be manageable with ongoing formal physical therapy and home exercise regimen. Prior lumbar MRI imaging shows left sided disc protrusion at L4-L5 that I feel is contributing to his symptoms. He would like to re-group with formal physical therapy at this time, I will go ahead and place new order. I also discussed possibility of performing lumbar epidural steroid injection should his pain increase, he would like to hold on injections at this time. I explained to him that this disc herniation at L4-L5 will likely re-absorb over time. His exam today is non focal, good strength noted to bilateral lower extremities. I encouraged him to reach out to us  with any concerns/worsening pain.     Meds & Orders: No orders of the defined types were placed in this encounter.   Orders Placed This Encounter  Procedures   Ambulatory referral to Physical Therapy    Follow-up: Return if symptoms worsen or fail to improve.   Procedures: No procedures performed      Clinical History: CLINICAL DATA:  Chronic low back pain radiating down the left leg   EXAM: MRI LUMBAR SPINE WITHOUT CONTRAST   TECHNIQUE: Multiplanar, multisequence MR imaging of the lumbar spine was performed. No intravenous contrast was administered.   COMPARISON:  Radiographs 03/17/2024   FINDINGS: Segmentation: The lowest lumbar type non-rib-bearing vertebra is labeled as L5.   Alignment:  No vertebral subluxation is observed.   Vertebrae: Substantial loss of disc height at L5-S1 and mild loss of disc height at L4-5. Mild type 1  degenerative endplate findings posteriorly at L4-5 and mild type 2 degenerative endplate findings at L5-S1.   Conus medullaris and cauda equina: Conus extends to the L1 level. Conus and cauda equina appear normal.   Paraspinal and other soft tissues: Unremarkable   Disc levels:   L1-2: Unremarkable   L2-3: Unremarkable   L3-4: No impingement.  Mild disc bulge   L4-5: Moderate  left foraminal stenosis and moderate left subarticular lateral recess stenosis due to a large left lateral recess disc protrusion which extends mostly cephalad. Mild disc bulge.   L5-S1: Borderline bilateral foraminal stenosis due to facet arthropathy and disc osteophyte complex.   IMPRESSION: 1. A substantial left lateral recess disc protrusion contributes to left-sided impingement at the L4-5 level.     Electronically Signed   By: Ryan Salvage M.D.   On: 04/03/2024 20:09   He reports that he has quit smoking. His smoking use included cigarettes. He has never used smokeless tobacco. No results for input(s): HGBA1C, LABURIC in the last 8760 hours.  Objective:  VS:  HT:    WT:   BMI:     BP:   HR: bpm  TEMP: ( )  RESP:  Physical Exam Vitals and nursing note reviewed.  HENT:     Head: Normocephalic and atraumatic.     Right Ear: External ear normal.     Left Ear: External ear normal.     Nose: Nose normal.     Mouth/Throat:     Mouth: Mucous membranes are moist.  Eyes:     Extraocular Movements: Extraocular movements intact.  Cardiovascular:     Rate and Rhythm: Normal rate.     Pulses: Normal pulses.  Pulmonary:     Effort: Pulmonary effort is normal.  Abdominal:     General: Abdomen is flat. There is no distension.  Musculoskeletal:        General: Tenderness present.     Cervical back: Normal range of motion.     Comments: Patient rises from seated position to standing without difficulty. Good lumbar range of motion. No pain noted with facet loading. 5/5 strength noted with bilateral hip flexion, knee flexion/extension, ankle dorsiflexion/plantarflexion and EHL. No clonus noted bilaterally. No pain upon palpation of greater trochanters. No pain with internal/external rotation of bilateral hips. Sensation intact bilaterally. Positive slump test on the right. Dysesthesias noted to left L5 dermatome. Ambulates without aid, gait steady.   Skin:    General: Skin  is warm and dry.     Capillary Refill: Capillary refill takes less than 2 seconds.  Neurological:     General: No focal deficit present.     Mental Status: He is alert and oriented to person, place, and time.  Psychiatric:        Mood and Affect: Mood normal.        Behavior: Behavior normal.     Ortho Exam  Imaging: No results found.  Past Medical/Family/Surgical/Social History: Medications & Allergies reviewed per EMR, new medications updated. There are no active problems to display for this patient.  Past Medical History:  Diagnosis Date   High cholesterol    Hypertension    Family History  Problem Relation Age of Onset   Diabetes Mother    Hypertension Mother    Diabetes Father    Hypertension Father    High Cholesterol Maternal Grandmother    Hypertension Maternal Grandmother    High Cholesterol Maternal Grandfather    Hypertension Maternal Grandfather  High Cholesterol Paternal Grandmother    Hypertension Paternal Grandmother    Hypertension Paternal Grandfather    High Cholesterol Paternal Grandfather    No past surgical history on file. Social History   Occupational History   Not on file  Tobacco Use   Smoking status: Former    Types: Cigarettes   Smokeless tobacco: Never   Tobacco comments:    CBD GUMMIES  Vaping Use   Vaping status: Former  Substance and Sexual Activity   Alcohol use: Yes    Comment: occ   Drug use: Not Currently    Comment: CBD   Sexual activity: Not on file

## 2024-07-16 NOTE — Progress Notes (Signed)
 Pain Scale   Average Pain 3 Patient advising he has chronic lower back pain radiating to left hip and side area        +Driver, -BT, -Dye Allergies.

## 2024-08-09 ENCOUNTER — Encounter: Payer: Self-pay | Admitting: Rehabilitative and Restorative Service Providers"

## 2024-08-09 ENCOUNTER — Ambulatory Visit: Admitting: Rehabilitative and Restorative Service Providers"

## 2024-08-09 DIAGNOSIS — M5459 Other low back pain: Secondary | ICD-10-CM

## 2024-08-09 DIAGNOSIS — M6281 Muscle weakness (generalized): Secondary | ICD-10-CM

## 2024-08-09 DIAGNOSIS — M25552 Pain in left hip: Secondary | ICD-10-CM | POA: Diagnosis not present

## 2024-08-09 DIAGNOSIS — M25551 Pain in right hip: Secondary | ICD-10-CM | POA: Diagnosis not present

## 2024-08-09 DIAGNOSIS — R262 Difficulty in walking, not elsewhere classified: Secondary | ICD-10-CM

## 2024-08-09 NOTE — Therapy (Signed)
 OUTPATIENT PHYSICAL THERAPY EVALUATION   Patient Name: Jonathon Holmes MRN: [id number] DOB:22-Jul-1986, 38 y.o., male Today's Date: 08/09/2024  END OF SESSION:  PT End of Session - 08/09/24 0928     Visit Number 1    Number of Visits 20    Date for Recertification  10/18/24    Authorization Type [employer] 20% coinsurance, 30 VL    Progress Note Due on Visit 10    PT Start Time 0928    PT Stop Time 0957    PT Time Calculation (min) 29 min    Activity Tolerance Patient tolerated treatment well    Behavior During Therapy [facility] for tasks assessed/performed           Past Medical History:  Diagnosis Date   High cholesterol    Hypertension    History reviewed. No pertinent surgical history. There are no active problems to display for this patient.   PCP: Stacia Millman PA  REFERRING PROVIDER: Trudy Duwaine BRAVO, NP  REFERRING DIAG:  [phone] (ICD-10-CM) - Chronic left-sided low back pain with left-sided sciatica  M54.16 (ICD-10-CM) - Radiculopathy, lumbar region  M51.16 (ICD-10-CM) - Intervertebral disc disorders with radiculopathy, lumbar region    Rationale for Evaluation and Treatment: Rehabilitation  THERAPY DIAG:  Other low back pain  Pain in left hip  Pain in right hip  Muscle weakness (generalized)  Difficulty in walking, not elsewhere classified  ONSET DATE: 1 to 1.5 years onset  SUBJECTIVE:                                                                                                                                                                                           SUBJECTIVE STATEMENT: Pt indicated same thing as before overall.  Pt indicated had one big week of symptoms increase that knocked him out (week was about 1 month ago)  Pt indicated exercises have been beneficial.  Pt indicated having some tightness in Rt hip as well.  Pt indicated stairs have seemed to be aggravating.    Pt indicated sleeping has been ok in last few weeks.  Reported bed mobility was impacted by pain week.     PERTINENT HISTORY:  From previous treatment cycle history:  Pt indicated history of symptoms seem to be seasonable and variable.  Pt indicated trying to look into trying prevent return of symptoms and response of pain symptoms.   Pt indicated feeling like some symptoms are getting aggravated recently.  Reported symptoms in Lt buttock with tightness as well as some things on Lt side.  Has noted some worsening with colder weather.    Reported some sweating at night  with some waking due to pain symptoms.  Pt indicated symptoms can go down Lt leg to knee but denied numbness/tingling.   Pt denied bowel and bladder symptoms at this time.   PAIN:  NPRS scale: at worst in last 2 weeks:  3-5/10.   Pain location: back, hip bilaterally  Pain description:  sharp, tightness, spasms Aggravating factors: lifting, bending, walking/standing prolonged, stairs Relieving factors: Aleve back and body, HEP  PRECAUTIONS: None  WEIGHT BEARING RESTRICTIONS: No  FALLS:  Has patient fallen in last 6 months? No  LIVING ENVIRONMENT: Lives in: House/apartment Stairs: will be a two story house.   OCCUPATION: Works at [employer]  PLOF: Independent, has young child.   PATIENT GOALS: Reduce pain complaints, get stronger and preventive for pain.     OBJECTIVE:   PATIENT SURVEYS:  Patient-Specific Activity Scoring Scheme  0 represents "unable to perform." 10 represents "able to perform at prior level. 0 1 2 3 4 5 6 7 8 9  10 (Date and Score)   Activity Eval  08/09/2024    1. Working with bending  7    2. Lifting and carrying (overhead)  7    3. Walking upright/standing 7   4. Stairs  5   5.    Score 6.5 avg    Total score = sum of the activity scores/number of activities Minimum detectable change (90%CI) for average score = 2 points Minimum detectable change (90%CI) for single activity score = 3 points  SCREENING FOR RED  FLAGS: 08/09/2024 Bowel or bladder incontinence: No Cauda equina syndrome: No  COGNITION: 08/09/2024 Overall cognitive status: WFL normal      SENSATION: 08/09/2024 WFL  MUSCLE LENGTH: 08/09/2024 Passive hamstring SLR Lt:  90 deg      Rt: 90  deg   POSTURE:  08/09/2024 Standing posture with equal iliac crest.  Mild reduced lumbar lordosis  PALPATION: 08/09/2024 Trigger points noted in glute max, Lt and Rt QL lumbar  LUMBAR ROM:  08/09/2024 Directional Preference Assessment: Centralization: not observed Peripheralization: not observed  AROM Eval 08/09/2024  Flexion To floor , tightness in back Gower's to return  Extension  75% WFL with tightness in lumbar  X5 100% with less symptoms  Right lateral flexion To knee joint  Left lateral flexion To knee joint with tightness Rt lumbar   Right rotation   Left rotation    (Blank rows = not tested)  LOWER EXTREMITY ROM:      Right Eval 08/09/2024 Left Eval 08/09/2024  Hip flexion    Hip extension    Hip abduction    Hip adduction    Hip internal rotation    Hip external rotation    Knee flexion    Knee extension    Ankle dorsiflexion    Ankle plantarflexion    Ankle inversion    Ankle eversion     (Blank rows = not tested)  LOWER EXTREMITY MMT:    MMT Right Eval 08/09/2024 Left Eval 08/09/2024  Hip flexion 5/5 5/5  Hip extension 4/5 4/5  Hip abduction  5/5  Hip adduction    Hip internal rotation    Hip external rotation    Knee flexion 5/5 5/5  Knee extension 5/5 5/5  Ankle dorsiflexion 5/5 5/5  Ankle plantarflexion    Ankle inversion    Ankle eversion     (Blank rows = not tested)  SPECIAL TESTS:  08/09/2024 (-) slump, crossed slr   FUNCTIONAL TESTS:  08/09/2024 18 inch chair transfer:  without UE assist   GAIT: 08/09/2024 Independent ambulation s deviation today.                                                                                                                                                                                                                     TODAY'S TREATMENT:                                                                                                         DATE: 08/09/2024  Therex: HEP instruction/performance c cues for techniques, handout provided.   Prone opposite arm/leg lift 3 sec hold x 10 bilaterally  Prone plank on knees to fatigue 1.5 mins (cues for techniques at home) Qruped bird dog reach opposite arm/leg 3 sec hold x 10 bilaterally   Manual Percussive device to Lt and Rt lumbar, superior buttock myofascial complaints.   PATIENT EDUCATION:  08/09/2024 Education details: HEP, POC Person educated: Patient Education method: Programmer, Multimedia, Demonstration, Verbal cues, and Handouts Education comprehension: verbalized understanding, returned demonstration, and verbal cues required  HOME EXERCISE PROGRAM: Access Code: [email or web address] URL: [email or web address] Date: 08/09/2024 Prepared by: Ozell Silvan  Exercises - Standing Lumbar Extension  - 2-3 x daily - 7 x weekly - 1 sets - 5 reps - Supine Figure 4 pull towards  - 2-3 x daily - 7 x weekly - 1 sets - 5 reps - 15-30 hold - Sidelying Lumbar Rotation Stretch  - 2-3 x daily - 7 x weekly - 1 sets - 5 reps - 15 hold - Supine Bridge  - 1-2 x daily - 7 x weekly - 1-2 sets - 10 reps - 2 hold - Standing Hip Hiking  - 1-2 x daily - 7 x weekly - 1 sets - 10 reps - 5 hold - Prone Alternating Arm and Leg Lifts  - 1-2 x daily - 7 x weekly - 1-2 sets - 10-15 reps - 3 hold - Bird Dog  - 1-2 x daily - 7 x weekly - 1-2 sets - 10-15 reps - 3 hold - Plank on  Knees  - 1 x daily - 7 x weekly - 1 sets - 3-5 reps - to fatigue hold  ASSESSMENT:  CLINICAL IMPRESSION: Patient is a 38 y.o. who comes to clinic with complaints of back pain, Lt hip/leg pain with mobility, strength and movement coordination deficits that impair their ability to perform usual daily and recreational functional activities  without increase difficulty/symptoms at this time.  Patient to benefit from skilled PT services to address impairments and limitations to improve to previous level of function without restriction secondary to condition.   OBJECTIVE IMPAIRMENTS: Abnormal gait, decreased activity tolerance, decreased coordination, decreased endurance, decreased mobility, difficulty walking, decreased ROM, decreased strength, hypomobility, increased fascial restrictions, impaired perceived functional ability, increased muscle spasms, impaired flexibility, improper body mechanics, postural dysfunction, and pain.   ACTIVITY LIMITATIONS: carrying, lifting, bending, standing, squatting, sleeping, stairs, transfers, reach over head, and locomotion level  PARTICIPATION LIMITATIONS: meal prep, interpersonal relationship, community activity, and occupation  PERSONAL FACTORS: Time since onset of injury/illness/exacerbation and Medical Hx: High cholesterol, HTN are also affecting patient's functional outcome.   REHAB POTENTIAL: Good  CLINICAL DECISION MAKING: Stable/uncomplicated  EVALUATION COMPLEXITY: Low   GOALS: Goals reviewed with patient? Yes  SHORT TERM GOALS: (target date for Short term goals are 3 weeks 08/30/2024)  1. Patient will demonstrate independent use of home exercise program to maintain progress from in clinic treatments.  Goal status: New  LONG TERM GOALS: (target dates for all long term goals are 10 weeks  10/18/2024 )   1. Patient will demonstrate/report pain at worst less than or equal to 2/10 to facilitate minimal limitation in daily activity secondary to pain symptoms.  Goal status: New   2. Patient will demonstrate independent use of home exercise program to facilitate ability to maintain/progress functional gains from skilled physical therapy services.  Goal status: New   3. Patient will demonstrate Patient specific functional scale avg > or = 8/10 to indicate reduced disability due to  condition.   Goal status: New   4. Patient will demonstrate lumbar extension 100 % WFL s symptoms to facilitate upright standing, walking posture at PLOF s limitation.  Goal status: New   5.  Patient will demonstrate bilateral LE MMT 5/5  to facilitate lift, carry at [facility] for work activity.   Goal status: New   6.  Patient will demonstrate lateral flexion to knee jt s symptoms for usual  mobility from lumbar.  Goal status: New    PLAN:  PT FREQUENCY: 1-2x/week  PT DURATION: 10 weeks  PLANNED INTERVENTIONS: Can include 02853- PT Re-evaluation, 97110-Therapeutic exercises, 97530- Therapeutic activity, [id number]- Neuromuscular re-education, 97535- Self Care, 97140- Manual therapy, [phone]- Gait training, [phone]- Orthotic Fit/training, [phone]- Canalith repositioning, [id number]- Aquatic Therapy, [phone]- Electrical stimulation (unattended), [id number] Physical performance testing, 97016- Vasopneumatic device, [id number]- Ultrasound, [id number]- Traction (mechanical), [id number]- Ionotophoresis 4mg /ml Dexamethasone,  20560 - Needle insertion w/o injection 1 or 2 muscles, 20561 - Needle insertion w/o injection 3 or more muscles.    Patient/Family education, Balance training, Stair training, Taping, Dry Needling, Joint mobilization, Joint manipulation, Spinal manipulation, Spinal mobilization, Scar mobilization, Vestibular training, Visual/preceptual remediation/compensation, DME instructions, Cryotherapy, and Moist heat.  All performed as medically necessary.  All included unless contraindicated  PLAN FOR NEXT SESSION: Review HEP knowledge/results.      Ozell Silvan, PT, DPT, OCS, ATC 08/09/24  9:59 AM

## 2024-08-23 ENCOUNTER — Ambulatory Visit: Admitting: Rehabilitative and Restorative Service Providers"

## 2024-08-23 ENCOUNTER — Encounter: Payer: Self-pay | Admitting: Rehabilitative and Restorative Service Providers"

## 2024-08-23 DIAGNOSIS — R262 Difficulty in walking, not elsewhere classified: Secondary | ICD-10-CM

## 2024-08-23 DIAGNOSIS — M25552 Pain in left hip: Secondary | ICD-10-CM

## 2024-08-23 DIAGNOSIS — M25551 Pain in right hip: Secondary | ICD-10-CM

## 2024-08-23 DIAGNOSIS — M6281 Muscle weakness (generalized): Secondary | ICD-10-CM

## 2024-08-23 DIAGNOSIS — M5459 Other low back pain: Secondary | ICD-10-CM

## 2024-08-23 NOTE — Therapy (Signed)
 OUTPATIENT PHYSICAL THERAPY TREATMENT   Patient Name: Jonathon Holmes MRN: [id number] DOB:Feb 17, 1986, 38 y.o., male Today's Date: 08/23/2024  END OF SESSION:  PT End of Session - 08/23/24 0912     Visit Number 2    Number of Visits 20    Date for Recertification  10/18/24    Authorization Type [employer] 20% coinsurance, 30 VL    Progress Note Due on Visit 10    PT Start Time 0929    PT Stop Time 1008    PT Time Calculation (min) 39 min    Activity Tolerance Patient tolerated treatment well    Behavior During Therapy WFL for tasks assessed/performed            Past Medical History:  Diagnosis Date   High cholesterol    Hypertension    History reviewed. No pertinent surgical history. There are no active problems to display for this patient.   PCP: Stacia Millman PA  REFERRING PROVIDER: Trudy Duwaine BRAVO, NP  REFERRING DIAG:  [phone] (ICD-10-CM) - Chronic left-sided low back pain with left-sided sciatica  M54.16 (ICD-10-CM) - Radiculopathy, lumbar region  M51.16 (ICD-10-CM) - Intervertebral disc disorders with radiculopathy, lumbar region    Rationale for Evaluation and Treatment: Rehabilitation  THERAPY DIAG:  Other low back pain  Pain in left hip  Pain in right hip  Muscle weakness (generalized)  Difficulty in walking, not elsewhere classified  ONSET DATE: 1 to 1.5 years onset  SUBJECTIVE:                                                                                                                                                                                           SUBJECTIVE STATEMENT: Pt indicated feeling complaints of tightness, pain in Rt anterior/lateral hip.  Reported feeling it at night and waking him up at times.  Pt indicated other complaints have been doing better.    PERTINENT HISTORY:  From previous treatment cycle history:  Pt indicated history of symptoms seem to be seasonable and variable.  Pt indicated trying to look into  trying prevent return of symptoms and response of pain symptoms.   Pt indicated feeling like some symptoms are getting aggravated recently.  Reported symptoms in Lt buttock with tightness as well as some things on Lt side.  Has noted some worsening with colder weather.    Reported some sweating at night with some waking due to pain symptoms.  Pt indicated symptoms can go down Lt leg to knee but denied numbness/tingling.   Pt denied bowel and bladder symptoms at this time.   PAIN:  NPRS scale: at worst in last day  or 2:  3-4/10 up to 6/10  Pain location: Rt hip Pain description:  sharp, tightness, spasms Aggravating factors: lifting, bending, walking/standing prolonged, stairs Relieving factors: Aleve back and body, HEP  PRECAUTIONS: None  WEIGHT BEARING RESTRICTIONS: No  FALLS:  Has patient fallen in last 6 months? No  LIVING ENVIRONMENT: Lives in: House/apartment Stairs: will be a two story house.   OCCUPATION: Works at [employer]  PLOF: Independent, has young child.   PATIENT GOALS: Reduce pain complaints, get stronger and preventive for pain.     OBJECTIVE:   PATIENT SURVEYS:  Patient-Specific Activity Scoring Scheme  0 represents unable to perform. 10 represents able to perform at prior level. 0 1 2 3 4 5 6 7 8 9  10 (Date and Score)   Activity Eval  08/09/2024    1. Working with bending  7    2. Lifting and carrying (overhead)  7    3. Walking upright/standing 7   4. Stairs  5   5.    Score 6.5 avg    Total score = sum of the activity scores/number of activities Minimum detectable change (90%CI) for average score = 2 points Minimum detectable change (90%CI) for single activity score = 3 points  SCREENING FOR RED FLAGS: 08/09/2024 Bowel or bladder incontinence: No Cauda equina syndrome: No  COGNITION: 08/09/2024 Overall cognitive status: WFL normal      SENSATION: 08/09/2024 [facility]  MUSCLE LENGTH: 08/23/2024: Debby test (+) bilateral for rectus  tightness with mild increase in Rt psoas tightness  08/09/2024 Passive hamstring SLR Lt:  90 deg      Rt: 90  deg   POSTURE:  08/09/2024 Standing posture with equal iliac crest.  Mild reduced lumbar lordosis  PALPATION: 08/09/2024 Trigger points noted in glute max, Lt and Rt QL lumbar  LUMBAR ROM:  08/09/2024 Directional Preference Assessment: Centralization: not observed Peripheralization: not observed  AROM Eval 08/09/2024 08/23/2024  Flexion To floor , tightness in back Gower's to return   Extension  75% WFL with tightness in lumbar  X5 100% with less symptoms 100% WFL  Right lateral flexion To knee joint   Left lateral flexion To knee joint with tightness Rt lumbar    Right rotation    Left rotation     (Blank rows = not tested)  LOWER EXTREMITY ROM:      Right Eval 08/09/2024 Left Eval 08/09/2024  Hip flexion    Hip extension    Hip abduction    Hip adduction    Hip internal rotation    Hip external rotation    Knee flexion    Knee extension    Ankle dorsiflexion    Ankle plantarflexion    Ankle inversion    Ankle eversion     (Blank rows = not tested)  LOWER EXTREMITY MMT:    MMT Right Eval 08/09/2024 Left Eval 08/09/2024  Hip flexion 5/5 5/5  Hip extension 4/5 4/5  Hip abduction  5/5  Hip adduction    Hip internal rotation    Hip external rotation    Knee flexion 5/5 5/5  Knee extension 5/5 5/5  Ankle dorsiflexion 5/5 5/5  Ankle plantarflexion    Ankle inversion    Ankle eversion     (Blank rows = not tested)  SPECIAL TESTS:  08/09/2024 (-) slump, crossed slr   FUNCTIONAL TESTS:  08/09/2024 18 inch chair transfer: without UE assist   GAIT: 08/09/2024 Independent ambulation s deviation today.  TODAY'S TREATMENT:                                                                                                          DATE: 08/23/2024  Therex: Nustep lvl 6 UE/LE for ROM, mobility, 6 mins.  Thomas test in supine 30 sec x 5 bilaterally (cues for home with encouragement of belt use for knee flexion) Supine bridge with blue band hip abduction hold , 2 x 10 with 5 sec hold in lift.  Sidelying clam shell blue band 2 x 15 with slow  Prone quad stretch with trap 30 sec x 2 bilaterally  Standing doorway hip hike with leg press into frame 5 sec hold x 10 bilaterally Prone plank on toes to fatigue  65 seconds,  46 seconds, 1:24 seconds Sidelying lumbar trunk rotation stretch 15 sec x 3 bilaterally   Manual Percussive device to Rt anterior anterior/lateral hip (rectus, TFL.    TODAY'S TREATMENT:                                                                                                         DATE: 08/09/2024  Therex: HEP instruction/performance c cues for techniques, handout provided.   Prone opposite arm/leg lift 3 sec hold x 10 bilaterally  Prone plank on knees to fatigue 1.5 mins (cues for techniques at home) Qruped bird dog reach opposite arm/leg 3 sec hold x 10 bilaterally   Manual Percussive device to Lt and Rt lumbar, superior buttock myofascial complaints.   PATIENT EDUCATION:  08/09/2024 Education details: HEP, POC Person educated: Patient Education method: Programmer, Multimedia, Demonstration, Verbal cues, and Handouts Education comprehension: verbalized understanding, returned demonstration, and verbal cues required  HOME EXERCISE PROGRAM: Access Code: [email or web address] URL: [email or web address] Date: 08/23/2024 Prepared by: Ozell Silvan  Exercises - Standing Lumbar Extension  - 2-3 x daily - 7 x weekly - 1 sets - 5 reps - Supine Figure 4 pull towards  - 2-3 x daily - 7 x weekly - 1 sets - 5 reps - 15-30 hold - Sidelying Lumbar Rotation Stretch  - 2-3 x daily - 7 x weekly - 1 sets - 5 reps - 15 hold -  Supine Bridge  - 1-2 x daily - 7 x weekly - 1-2 sets - 10 reps - 2 hold - Hip Flexor Stretch at Edge of Bed  - 1-2 x daily - 7 x weekly - 1 sets - 5 reps - 15 hold - Standing Hip Hiking  - 1-2 x daily - 7 x weekly - 1 sets - 10 reps - 5 hold - Prone Alternating Arm and Leg Lifts  - 1-2  x daily - 7 x weekly - 1-2 sets - 10-15 reps - 3 hold - Bird Dog  - 1-2 x daily - 7 x weekly - 1-2 sets - 10-15 reps - 3 hold - Plank on Knees  - 1 x daily - 7 x weekly - 1 sets - 3-5 reps - to fatigue hold  ASSESSMENT:  CLINICAL IMPRESSION: Complaints more centered to proximal rectus, TFL region on Rt hip today.  Discussed stretching options for home use to address (thomas test, prone quad with strap).  Combination of psoas and rectus tightness noted.   OBJECTIVE IMPAIRMENTS: Abnormal gait, decreased activity tolerance, decreased coordination, decreased endurance, decreased mobility, difficulty walking, decreased ROM, decreased strength, hypomobility, increased fascial restrictions, impaired perceived functional ability, increased muscle spasms, impaired flexibility, improper body mechanics, postural dysfunction, and pain.   ACTIVITY LIMITATIONS: carrying, lifting, bending, standing, squatting, sleeping, stairs, transfers, reach over head, and locomotion level  PARTICIPATION LIMITATIONS: meal prep, interpersonal relationship, community activity, and occupation  PERSONAL FACTORS: Time since onset of injury/illness/exacerbation and Medical Hx: High cholesterol, HTN are also affecting patient's functional outcome.   REHAB POTENTIAL: Good  CLINICAL DECISION MAKING: Stable/uncomplicated  EVALUATION COMPLEXITY: Low   GOALS: Goals reviewed with patient? Yes  SHORT TERM GOALS: (target date for Short term goals are 3 weeks 08/30/2024)  1. Patient will demonstrate independent use of home exercise program to maintain progress from in clinic treatments.  Goal status: on going 08/23/2024  LONG TERM GOALS:  (target dates for all long term goals are 10 weeks  10/18/2024 )   1. Patient will demonstrate/report pain at worst less than or equal to 2/10 to facilitate minimal limitation in daily activity secondary to pain symptoms.  Goal status: on going 08/23/2024   2. Patient will demonstrate independent use of home exercise program to facilitate ability to maintain/progress functional gains from skilled physical therapy services.  Goal status: on going 08/23/2024   3. Patient will demonstrate Patient specific functional scale avg > or = 8/10 to indicate reduced disability due to condition.   Goal status: on going 08/23/2024   4. Patient will demonstrate lumbar extension 100 % WFL s symptoms to facilitate upright standing, walking posture at PLOF s limitation.  Goal status: Met 08/23/2024   5.  Patient will demonstrate bilateral LE MMT 5/5  to facilitate lift, carry at [facility] for work activity.   Goal status: on going 08/23/2024   6.  Patient will demonstrate lateral flexion to knee jt s symptoms for usual  mobility from lumbar.  Goal status: on going 08/23/2024    PLAN:  PT FREQUENCY: 1-2x/week  PT DURATION: 10 weeks  PLANNED INTERVENTIONS: Can include 02853- PT Re-evaluation, 97110-Therapeutic exercises, 97530- Therapeutic activity, 97112- Neuromuscular re-education, 97535- Self Care, 97140- Manual therapy, [phone]- Gait training, [phone]- Orthotic Fit/training, [phone]- Canalith repositioning, [id number]- Aquatic Therapy, [phone]- Electrical stimulation (unattended), [id number] Physical performance testing, 97016- Vasopneumatic device, [id number]- Ultrasound, [id number]- Traction (mechanical), [id number]- Ionotophoresis 4mg /ml Dexamethasone,  20560 - Needle insertion w/o injection 1 or 2 muscles, 20561 - Needle insertion w/o injection 3 or more muscles.    Patient/Family education, Balance training, Stair training, Taping, Dry Needling, Joint mobilization, Joint manipulation, Spinal manipulation, Spinal mobilization, Scar  mobilization, Vestibular training, Visual/preceptual remediation/compensation, DME instructions, Cryotherapy, and Moist heat.  All performed as medically necessary.  All included unless contraindicated  PLAN FOR NEXT SESSION: Continue lumbar/hip mobility gains with functional endurance/strengthening.      Ozell Silvan, PT, DPT, OCS, ATC 08/23/2024  10:08 AM

## 2024-09-07 ENCOUNTER — Ambulatory Visit (INDEPENDENT_AMBULATORY_CARE_PROVIDER_SITE_OTHER): Admitting: Rehabilitative and Restorative Service Providers"

## 2024-09-07 ENCOUNTER — Encounter: Payer: Self-pay | Admitting: Rehabilitative and Restorative Service Providers"

## 2024-09-07 DIAGNOSIS — M6281 Muscle weakness (generalized): Secondary | ICD-10-CM | POA: Diagnosis not present

## 2024-09-07 DIAGNOSIS — M5459 Other low back pain: Secondary | ICD-10-CM

## 2024-09-07 DIAGNOSIS — M25551 Pain in right hip: Secondary | ICD-10-CM | POA: Diagnosis not present

## 2024-09-07 DIAGNOSIS — M25552 Pain in left hip: Secondary | ICD-10-CM | POA: Diagnosis not present

## 2024-09-07 DIAGNOSIS — R262 Difficulty in walking, not elsewhere classified: Secondary | ICD-10-CM | POA: Diagnosis not present

## 2024-09-07 NOTE — Therapy (Signed)
 " OUTPATIENT PHYSICAL THERAPY TREATMENT   Patient Name: Jonathon Holmes MRN: [id number] DOB:February 19, 1986, 38 y.o., male Today's Date: 09/07/2024  END OF SESSION:  PT End of Session - 09/07/24 0903     Visit Number 3    Number of Visits 20    Date for Recertification  10/18/24    Authorization Type [employer] 20% coinsurance, 30 VL    Progress Note Due on Visit 10    PT Start Time 0932    PT Stop Time 1011    PT Time Calculation (min) 39 min    Activity Tolerance Patient tolerated treatment well    Behavior During Therapy WFL for tasks assessed/performed             Past Medical History:  Diagnosis Date   High cholesterol    Hypertension    History reviewed. No pertinent surgical history. There are no active problems to display for this patient.   PCP: Stacia Millman PA  REFERRING PROVIDER: Trudy Duwaine BRAVO, NP  REFERRING DIAG:  [phone] (ICD-10-CM) - Chronic left-sided low back pain with left-sided sciatica  M54.16 (ICD-10-CM) - Radiculopathy, lumbar region  M51.16 (ICD-10-CM) - Intervertebral disc disorders with radiculopathy, lumbar region    Rationale for Evaluation and Treatment: Rehabilitation  THERAPY DIAG:  Other low back pain  Pain in left hip  Pain in right hip  Muscle weakness (generalized)  Difficulty in walking, not elsewhere classified  ONSET DATE: 1 to 1.5 years onset  SUBJECTIVE:                                                                                                                                                                                           SUBJECTIVE STATEMENT: Pt indicated slight tightness upon waking.  Reported feeling pretty good overall.     PERTINENT HISTORY:  From previous treatment cycle history:  Pt indicated history of symptoms seem to be seasonable and variable.  Pt indicated trying to look into trying prevent return of symptoms and response of pain symptoms.   Pt indicated feeling like some symptoms  are getting aggravated recently.  Reported symptoms in Lt buttock with tightness as well as some things on Lt side.  Has noted some worsening with colder weather.    Reported some sweating at night with some waking due to pain symptoms.  Pt indicated symptoms can go down Lt leg to knee but denied numbness/tingling.   Pt denied bowel and bladder symptoms at this time.   PAIN:  NPRS scale: at worst in last day or 2:  3-4/10 up to 6/10  Pain location: Rt hip Pain description:  sharp, tightness,  spasms Aggravating factors: lifting, bending, walking/standing prolonged, stairs Relieving factors: Aleve back and body, HEP  PRECAUTIONS: None  WEIGHT BEARING RESTRICTIONS: No  FALLS:  Has patient fallen in last 6 months? No  LIVING ENVIRONMENT: Lives in: House/apartment Stairs: will be a two story house.   OCCUPATION: Works at [employer]  PLOF: Independent, has young child.   PATIENT GOALS: Reduce pain complaints, get stronger and preventive for pain.     OBJECTIVE:   PATIENT SURVEYS:  Patient-Specific Activity Scoring Scheme  0 represents unable to perform. 10 represents able to perform at prior level. 0 1 2 3 4 5 6 7 8 9  10 (Date and Score)   Activity Eval  08/09/2024 09/07/2024   1. Working with bending  7 10   2. Lifting and carrying (overhead)  7  10  3. Walking upright/standing 7 10  4. Stairs  5 10  5.    Score 6.5 avg 10 avg    Total score = sum of the activity scores/number of activities Minimum detectable change (90%CI) for average score = 2 points Minimum detectable change (90%CI) for single activity score = 3 points  SCREENING FOR RED FLAGS: 08/09/2024 Bowel or bladder incontinence: No Cauda equina syndrome: No  COGNITION: 08/09/2024 Overall cognitive status: WFL normal      SENSATION: 08/09/2024 [facility]  MUSCLE LENGTH: 08/23/2024: Debby test (+) bilateral for rectus tightness with mild increase in Rt psoas tightness  08/09/2024 Passive hamstring SLR  Lt:  90 deg      Rt: 90  deg   POSTURE:  08/09/2024 Standing posture with equal iliac crest.  Mild reduced lumbar lordosis  PALPATION: 08/09/2024 Trigger points noted in glute max, Lt and Rt QL lumbar  LUMBAR ROM:  08/09/2024 Directional Preference Assessment: Centralization: not observed Peripheralization: not observed  AROM Eval 08/09/2024 08/23/2024  Flexion To floor , tightness in back Gower's to return   Extension  75% WFL with tightness in lumbar  X5 100% with less symptoms 100% WFL  Right lateral flexion To knee joint   Left lateral flexion To knee joint with tightness Rt lumbar    Right rotation    Left rotation     (Blank rows = not tested)  LOWER EXTREMITY ROM:      Right Eval 08/09/2024 Left Eval 08/09/2024  Hip flexion    Hip extension    Hip abduction    Hip adduction    Hip internal rotation    Hip external rotation    Knee flexion    Knee extension    Ankle dorsiflexion    Ankle plantarflexion    Ankle inversion    Ankle eversion     (Blank rows = not tested)  LOWER EXTREMITY MMT:    MMT Right Eval 08/09/2024 Left Eval 08/09/2024  Hip flexion 5/5 5/5  Hip extension 4/5 4/5  Hip abduction  5/5  Hip adduction    Hip internal rotation    Hip external rotation    Knee flexion 5/5 5/5  Knee extension 5/5 5/5  Ankle dorsiflexion 5/5 5/5  Ankle plantarflexion    Ankle inversion    Ankle eversion     (Blank rows = not tested)  SPECIAL TESTS:  08/09/2024 (-) slump, crossed slr   FUNCTIONAL TESTS:  08/09/2024 18 inch chair transfer: without UE assist   GAIT: 08/09/2024 Independent ambulation s deviation today.  TODAY'S TREATMENT:                                                                                                          DATE: 09/07/2024  Therex: Recumbent bike lvl 5 with intervals :15 seconds  faster each minute - 10 mins total, seat 6 Standing hip hike in doorway hip abduction into door frame on lift leg 5 sec hold x 10  Standing antirotation walk outs double blue band 5 sec hold x 10 bilaterally   TherActivity (to improve squat, walking, standing, work activity) Leg press double leg x 15 125 lbs, x 15 150 lbs with slow lowering focus Leg press single leg 2 x 15 75 lbs  Single leg split squat x 15 bilaterally with slow lowering fcous.  Lateral step down 6 inch step education x 5 bilaterally      TODAY'S TREATMENT:                                                                                                         DATE: 08/23/2024  Therex: Nustep lvl 6 UE/LE for ROM, mobility, 6 mins.  Thomas test in supine 30 sec x 5 bilaterally (cues for home with encouragement of belt use for knee flexion) Supine bridge with blue band hip abduction hold , 2 x 10 with 5 sec hold in lift.  Sidelying clam shell blue band 2 x 15 with slow  Prone quad stretch with trap 30 sec x 2 bilaterally  Standing doorway hip hike with leg press into frame 5 sec hold x 10 bilaterally Prone plank on toes to fatigue  65 seconds,  46 seconds, 1:24 seconds Sidelying lumbar trunk rotation stretch 15 sec x 3 bilaterally   Manual Percussive device to Rt anterior anterior/lateral hip (rectus, TFL.    TODAY'S TREATMENT:                                                                                                         DATE: 08/09/2024  Therex: HEP instruction/performance c cues for techniques, handout provided.   Prone opposite arm/leg lift 3 sec hold x 10 bilaterally  Prone plank on knees to fatigue 1.5 mins (cues for  techniques at home) Qruped bird dog reach opposite arm/leg 3 sec hold x 10 bilaterally   Manual Percussive device to Lt and Rt lumbar, superior buttock myofascial complaints.   PATIENT EDUCATION:   08/09/2024 Education details: HEP, POC Person educated: Patient Education method: Programmer, Multimedia, Demonstration, Verbal cues, and Handouts Education comprehension: verbalized understanding, returned demonstration, and verbal cues required  HOME EXERCISE PROGRAM: Access Code: [email or web address] URL: [email or web address] Date: 08/23/2024 Prepared by: Ozell Silvan  Exercises - Standing Lumbar Extension  - 2-3 x daily - 7 x weekly - 1 sets - 5 reps - Supine Figure 4 pull towards  - 2-3 x daily - 7 x weekly - 1 sets - 5 reps - 15-30 hold - Sidelying Lumbar Rotation Stretch  - 2-3 x daily - 7 x weekly - 1 sets - 5 reps - 15 hold - Supine Bridge  - 1-2 x daily - 7 x weekly - 1-2 sets - 10 reps - 2 hold - Hip Flexor Stretch at Edge of Bed  - 1-2 x daily - 7 x weekly - 1 sets - 5 reps - 15 hold - Standing Hip Hiking  - 1-2 x daily - 7 x weekly - 1 sets - 10 reps - 5 hold - Prone Alternating Arm and Leg Lifts  - 1-2 x daily - 7 x weekly - 1-2 sets - 10-15 reps - 3 hold - Bird Dog  - 1-2 x daily - 7 x weekly - 1-2 sets - 10-15 reps - 3 hold - Plank on Knees  - 1 x daily - 7 x weekly - 1 sets - 3-5 reps - to fatigue hold  ASSESSMENT:  CLINICAL IMPRESSION: PSFS was noted 10/10 today with good reporting of symptoms.  Continued positive improvement in tolerance to work activity performance.   Plan for recheck of hip strength,ROM next visit with possible HEP transitioning pending symptoms.   OBJECTIVE IMPAIRMENTS: Abnormal gait, decreased activity tolerance, decreased coordination, decreased endurance, decreased mobility, difficulty walking, decreased ROM, decreased strength, hypomobility, increased fascial restrictions, impaired perceived functional ability, increased muscle spasms, impaired flexibility, improper body mechanics, postural dysfunction, and pain.   ACTIVITY LIMITATIONS: carrying, lifting, bending, standing, squatting, sleeping, stairs, transfers, reach over head, and locomotion  level  PARTICIPATION LIMITATIONS: meal prep, interpersonal relationship, community activity, and occupation  PERSONAL FACTORS: Time since onset of injury/illness/exacerbation and Medical Hx: High cholesterol, HTN are also affecting patient's functional outcome.   REHAB POTENTIAL: Good  CLINICAL DECISION MAKING: Stable/uncomplicated  EVALUATION COMPLEXITY: Low   GOALS: Goals reviewed with patient? Yes  SHORT TERM GOALS: (target date for Short term goals are 3 weeks 08/30/2024)  1. Patient will demonstrate independent use of home exercise program to maintain progress from in clinic treatments.  Goal status: on going 08/23/2024  LONG TERM GOALS: (target dates for all long term goals are 10 weeks  10/18/2024 )   1. Patient will demonstrate/report pain at worst less than or equal to 2/10 to facilitate minimal limitation in daily activity secondary to pain symptoms.  Goal status: on going 08/23/2024   2. Patient will demonstrate independent use of home exercise program to facilitate ability to maintain/progress functional gains from skilled physical therapy services.  Goal status: on going 08/23/2024   3. Patient will demonstrate Patient specific functional scale avg > or = 8/10 to indicate reduced disability due to condition.   Goal status: Met 09/07/2024   4. Patient will demonstrate lumbar extension 100 % WFL s symptoms to facilitate upright standing, walking posture  at PLOF s limitation.  Goal status: Met 08/23/2024   5.  Patient will demonstrate bilateral LE MMT 5/5  to facilitate lift, carry at [facility] for work activity.   Goal status: on going 08/23/2024   6.  Patient will demonstrate lateral flexion to knee jt s symptoms for usual  mobility from lumbar.  Goal status: on going 08/23/2024    PLAN:  PT FREQUENCY: 1-2x/week  PT DURATION: 10 weeks  PLANNED INTERVENTIONS: Can include 02853- PT Re-evaluation, 97110-Therapeutic exercises, 97530- Therapeutic activity, 97112-  Neuromuscular re-education, 97535- Self Care, 97140- Manual therapy, [phone]- Gait training, [phone]- Orthotic Fit/training, [phone]- Canalith repositioning, [id number]- Aquatic Therapy, [phone]- Electrical stimulation (unattended), [id number] Physical performance testing, 97016- Vasopneumatic device, [id number]- Ultrasound, [id number]- Traction (mechanical), [id number]- Ionotophoresis 4mg /ml Dexamethasone,  20560 - Needle insertion w/o injection 1 or 2 muscles, 20561 - Needle insertion w/o injection 3 or more muscles.    Patient/Family education, Balance training, Stair training, Taping, Dry Needling, Joint mobilization, Joint manipulation, Spinal manipulation, Spinal mobilization, Scar mobilization, Vestibular training, Visual/preceptual remediation/compensation, DME instructions, Cryotherapy, and Moist heat.  All performed as medically necessary.  All included unless contraindicated  PLAN FOR NEXT SESSION: LE strength, Rom update.  HEP transitioning?    Ozell Silvan, PT, DPT, OCS, ATC 09/07/2024  10:09 AM      "

## 2024-09-15 ENCOUNTER — Encounter: Admitting: Rehabilitative and Restorative Service Providers"

## 2024-09-15 NOTE — Therapy (Incomplete)
 " OUTPATIENT PHYSICAL THERAPY TREATMENT   Patient Name: Jonathon Holmes MRN: [id number] DOB:[birth year], 39 y.o., male Today's Date: 09/15/2024  END OF SESSION:       Past Medical History:  Diagnosis Date   High cholesterol    Hypertension    No past surgical history on file. There are no active problems to display for this patient.   PCP: Stacia Millman PA  REFERRING PROVIDER: Trudy Duwaine BRAVO, NP  REFERRING DIAG:  [id number] (ICD-10-CM) - Chronic left-sided low back pain with left-sided sciatica  M54.16 (ICD-10-CM) - Radiculopathy, lumbar region  M51.16 (ICD-10-CM) - Intervertebral disc disorders with radiculopathy, lumbar region    Rationale for Evaluation and Treatment: Rehabilitation  THERAPY DIAG:  No diagnosis found.  ONSET DATE: 1 to 1.5 years onset  SUBJECTIVE:                                                                                                                                                                                           SUBJECTIVE STATEMENT: Pt indicated slight tightness upon waking.  Reported feeling pretty good overall.     PERTINENT HISTORY:  From previous treatment cycle history:  Pt indicated history of symptoms seem to be seasonable and variable.  Pt indicated trying to look into trying prevent return of symptoms and response of pain symptoms.   Pt indicated feeling like some symptoms are getting aggravated recently.  Reported symptoms in Lt buttock with tightness as well as some things on Lt side.  Has noted some worsening with colder weather.    Reported some sweating at night with some waking due to pain symptoms.  Pt indicated symptoms can go down Lt leg to knee but denied numbness/tingling.   Pt denied bowel and bladder symptoms at this time.   PAIN:  NPRS scale: at worst in last day or 2:  3-4/10 up to 6/10  Pain location: Rt hip Pain description:  sharp, tightness, spasms Aggravating factors: lifting, bending,  walking/standing prolonged, stairs Relieving factors: Aleve back and body, HEP  PRECAUTIONS: None  WEIGHT BEARING RESTRICTIONS: No  FALLS:  Has patient fallen in last 6 months? No  LIVING ENVIRONMENT: Lives in: House/apartment Stairs: will be a two story house.   OCCUPATION: Works at [employer]  PLOF: Independent, has young child.   PATIENT GOALS: Reduce pain complaints, get stronger and preventive for pain.     OBJECTIVE:   PATIENT SURVEYS:  Patient-Specific Activity Scoring Scheme  0 represents unable to perform. 10 represents able to perform at prior level. 0 1 2 3 4 5 6 7 8 9  10 (Date and Score)   Activity Eval  08/09/2024 09/07/2024   1. Working with bending  7 10   2. Lifting and carrying (overhead)  7  10  3. Walking upright/standing 7 10  4. Stairs  5 10  5.    Score 6.5 avg 10 avg    Total score = sum of the activity scores/number of activities Minimum detectable change (90%CI) for average score = 2 points Minimum detectable change (90%CI) for single activity score = 3 points  SCREENING FOR RED FLAGS: 08/09/2024 Bowel or bladder incontinence: No Cauda equina syndrome: No  COGNITION: 08/09/2024 Overall cognitive status: WFL normal      SENSATION: 08/09/2024 [facility]  MUSCLE LENGTH: 08/23/2024: Debby test (+) bilateral for rectus tightness with mild increase in Rt psoas tightness  08/09/2024 Passive hamstring SLR Lt:  90 deg      Rt: 90  deg   POSTURE:  08/09/2024 Standing posture with equal iliac crest.  Mild reduced lumbar lordosis  PALPATION: 08/09/2024 Trigger points noted in glute max, Lt and Rt QL lumbar  LUMBAR ROM:  08/09/2024 Directional Preference Assessment: Centralization: not observed Peripheralization: not observed  AROM Eval 08/09/2024 08/23/2024  Flexion To floor , tightness in back Gower's to return   Extension  75% WFL with tightness in lumbar  X5 100% with less symptoms 100% WFL  Right lateral flexion To knee joint    Left lateral flexion To knee joint with tightness Rt lumbar    Right rotation    Left rotation     (Blank rows = not tested)  LOWER EXTREMITY ROM:      Right Eval 08/09/2024 Left Eval 08/09/2024  Hip flexion    Hip extension    Hip abduction    Hip adduction    Hip internal rotation    Hip external rotation    Knee flexion    Knee extension    Ankle dorsiflexion    Ankle plantarflexion    Ankle inversion    Ankle eversion     (Blank rows = not tested)  LOWER EXTREMITY MMT:    MMT Right Eval 08/09/2024 Left Eval 08/09/2024  Hip flexion 5/5 5/5  Hip extension 4/5 4/5  Hip abduction  5/5  Hip adduction    Hip internal rotation    Hip external rotation    Knee flexion 5/5 5/5  Knee extension 5/5 5/5  Ankle dorsiflexion 5/5 5/5  Ankle plantarflexion    Ankle inversion    Ankle eversion     (Blank rows = not tested)  SPECIAL TESTS:  08/09/2024 (-) slump, crossed slr   FUNCTIONAL TESTS:  08/09/2024 18 inch chair transfer: without UE assist   GAIT: 08/09/2024 Independent ambulation s deviation today.  TODAY'S TREATMENT:                                                                                                         DATE: 09/15/2024  Therex:  TODAY'S TREATMENT:                                                                                                         DATE: 09/07/2024  Therex: Recumbent bike lvl 5 with intervals :15 seconds  faster each minute - 10 mins total, seat 6 Standing hip hike in doorway hip abduction into door frame on lift leg 5 sec hold x 10  Standing antirotation walk outs double blue band 5 sec hold x 10 bilaterally   TherActivity (to improve squat, walking, standing, work activity) Leg press double leg x 15 125 lbs, x 15 150  lbs with slow lowering focus Leg press single leg 2 x 15 75 lbs  Single leg split squat x 15 bilaterally with slow lowering fcous.  Lateral step down 6 inch step education x 5 bilaterally      TODAY'S TREATMENT:                                                                                                         DATE: 08/23/2024  Therex: Nustep lvl 6 UE/LE for ROM, mobility, 6 mins.  Thomas test in supine 30 sec x 5 bilaterally (cues for home with encouragement of belt use for knee flexion) Supine bridge with blue band hip abduction hold , 2 x 10 with 5 sec hold in lift.  Sidelying clam shell blue band 2 x 15 with slow  Prone quad stretch with trap 30 sec x 2 bilaterally  Standing doorway hip hike with leg press into frame 5 sec hold x 10 bilaterally Prone plank on toes to fatigue  65 seconds,  46 seconds, 1:24 seconds Sidelying lumbar trunk rotation stretch 15 sec x 3 bilaterally   Manual Percussive device to Rt anterior anterior/lateral hip (rectus, TFL.     PATIENT EDUCATION:  08/09/2024 Education details: HEP, POC Person educated: Patient Education method: Explanation, Demonstration, Verbal cues, and Handouts Education comprehension: verbalized understanding, returned demonstration, and verbal cues required  HOME EXERCISE PROGRAM: Access Code: [email or web address] URL: [email or web address] Date: 08/23/2024 Prepared by: Ozell Silvan  Exercises - Standing Lumbar Extension  - 2-3 x daily - 7 x weekly - 1 sets - 5 reps - Supine Figure 4 pull towards  - 2-3 x daily - 7 x weekly - 1 sets - 5 reps - 15-30 hold - Sidelying Lumbar Rotation Stretch  - 2-3 x daily - 7 x weekly - 1 sets - 5 reps - 15 hold - Supine Bridge  - 1-2 x daily - 7 x weekly - 1-2 sets - 10 reps - 2 hold - Hip Flexor Stretch at Edge of Bed  - 1-2 x daily - 7 x weekly - 1 sets - 5 reps - 15 hold - Standing Hip Hiking  - 1-2 x daily - 7 x weekly - 1 sets - 10 reps - 5 hold - Prone Alternating Arm and  Leg Lifts  - 1-2 x daily - 7 x weekly - 1-2 sets - 10-15 reps - 3 hold - Bird Dog  - 1-2 x daily - 7 x weekly - 1-2 sets - 10-15 reps - 3 hold - Plank on Knees  - 1 x daily - 7 x weekly - 1 sets - 3-5 reps - to fatigue hold  ASSESSMENT:  CLINICAL IMPRESSION: PSFS was noted 10/10 today with good reporting of symptoms.  Continued positive improvement in tolerance to work activity performance.   Plan for recheck of hip strength,ROM next visit with possible HEP transitioning pending symptoms.   OBJECTIVE IMPAIRMENTS: Abnormal gait, decreased activity tolerance, decreased coordination, decreased endurance, decreased mobility, difficulty walking, decreased ROM, decreased strength, hypomobility, increased fascial restrictions, impaired perceived functional ability, increased muscle spasms, impaired flexibility, improper body mechanics, postural dysfunction, and pain.   ACTIVITY LIMITATIONS: carrying, lifting, bending, standing, squatting, sleeping, stairs, transfers, reach over head, and locomotion level  PARTICIPATION LIMITATIONS: meal prep, interpersonal relationship, community activity, and occupation  PERSONAL FACTORS: Time since onset of injury/illness/exacerbation and Medical Hx: High cholesterol, HTN are also affecting patient's functional outcome.   REHAB POTENTIAL: Good  CLINICAL DECISION MAKING: Stable/uncomplicated  EVALUATION COMPLEXITY: Low   GOALS: Goals reviewed with patient? Yes  SHORT TERM GOALS: (target date for Short term goals are 3 weeks 08/30/2024)  1. Patient will demonstrate independent use of home exercise program to maintain progress from in clinic treatments.  Goal status: on going 08/23/2024  LONG TERM GOALS: (target dates for all long term goals are 10 weeks  10/18/2024 )   1. Patient will demonstrate/report pain at worst less than or equal to 2/10 to facilitate minimal limitation in daily activity secondary to pain symptoms.  Goal status: on going  08/23/2024   2. Patient will demonstrate independent use of home exercise program to facilitate ability to maintain/progress functional gains from skilled physical therapy services.  Goal status: on going 08/23/2024   3. Patient will demonstrate Patient specific functional scale avg > or = 8/10 to indicate reduced disability due to condition.   Goal status: Met 09/07/2024   4. Patient will demonstrate lumbar extension 100 % WFL s symptoms to facilitate upright standing, walking posture at PLOF s limitation.  Goal status: Met 08/23/2024   5.  Patient will demonstrate bilateral LE MMT 5/5  to facilitate lift, carry at [facility] for work activity.   Goal status: on going 08/23/2024   6.  Patient will demonstrate lateral flexion to knee jt s symptoms for usual  mobility from lumbar.  Goal status: on  going 08/23/2024    PLAN:  PT FREQUENCY: 1-2x/week  PT DURATION: 10 weeks  PLANNED INTERVENTIONS: Can include 02853- PT Re-evaluation, 97110-Therapeutic exercises, 97530- Therapeutic activity, 97112- Neuromuscular re-education, 97535- Self Care, 97140- Manual therapy, [phone]- Gait training, [phone]- Orthotic Fit/training, [phone]- Canalith repositioning, [id number]- Aquatic Therapy, [phone]- Electrical stimulation (unattended), [id number] Physical performance testing, 97016- Vasopneumatic device, [id number]- Ultrasound, [id number]- Traction (mechanical), [id number]- Ionotophoresis 4mg /ml Dexamethasone,  20560 - Needle insertion w/o injection 1 or 2 muscles, 20561 - Needle insertion w/o injection 3 or more muscles.    Patient/Family education, Balance training, Stair training, Taping, Dry Needling, Joint mobilization, Joint manipulation, Spinal manipulation, Spinal mobilization, Scar mobilization, Vestibular training, Visual/preceptual remediation/compensation, DME instructions, Cryotherapy, and Moist heat.  All performed as medically necessary.  All included unless contraindicated  PLAN FOR NEXT SESSION: LE strength, Rom update.   HEP transitioning?    Ozell Silvan, PT, DPT, OCS, ATC 09/15/2024  8:28 AM      "
